# Patient Record
Sex: Female | Born: 1940
Health system: Southern US, Community
[De-identification: ages and names within clinical notes are randomized; demographics above are authoritative.]

## PROBLEM LIST (undated history)

## (undated) DIAGNOSIS — F419 Anxiety disorder, unspecified: Secondary | ICD-10-CM

## (undated) DIAGNOSIS — F329 Major depressive disorder, single episode, unspecified: Secondary | ICD-10-CM

## (undated) DIAGNOSIS — Z9289 Personal history of other medical treatment: Secondary | ICD-10-CM

## (undated) DIAGNOSIS — K819 Cholecystitis, unspecified: Secondary | ICD-10-CM

## (undated) DIAGNOSIS — E785 Hyperlipidemia, unspecified: Secondary | ICD-10-CM

## (undated) DIAGNOSIS — F32A Depression, unspecified: Secondary | ICD-10-CM

## (undated) DIAGNOSIS — I10 Essential (primary) hypertension: Secondary | ICD-10-CM

## (undated) DIAGNOSIS — I639 Cerebral infarction, unspecified: Secondary | ICD-10-CM

## (undated) DIAGNOSIS — Z8616 Personal history of COVID-19: Secondary | ICD-10-CM

## (undated) DIAGNOSIS — Z87828 Personal history of other (healed) physical injury and trauma: Secondary | ICD-10-CM

## (undated) DIAGNOSIS — K5909 Other constipation: Secondary | ICD-10-CM

## (undated) DIAGNOSIS — M1990 Unspecified osteoarthritis, unspecified site: Secondary | ICD-10-CM

## (undated) DIAGNOSIS — Z8673 Personal history of transient ischemic attack (TIA), and cerebral infarction without residual deficits: Secondary | ICD-10-CM

## (undated) DIAGNOSIS — R252 Cramp and spasm: Secondary | ICD-10-CM

## (undated) HISTORY — PX: COLONOSCOPY: SHX174

## (undated) HISTORY — PX: DIAGNOSTIC LAPAROSCOPY: SUR761

## (undated) HISTORY — PX: APPENDECTOMY: SHX54

## (undated) HISTORY — PX: EXPLORATORY LAPAROTOMY: SUR591

## (undated) HISTORY — PX: ABDOMINAL HYSTERECTOMY: SHX81

## (undated) HISTORY — PX: DILATION AND CURETTAGE OF UTERUS: SHX78

## (undated) HISTORY — PX: TONSILLECTOMY: SUR1361

---

## 2000-05-02 ENCOUNTER — Encounter (INDEPENDENT_AMBULATORY_CARE_PROVIDER_SITE_OTHER): Payer: Self-pay

## 2000-05-02 ENCOUNTER — Ambulatory Visit (HOSPITAL_COMMUNITY): Admission: RE | Admit: 2000-05-02 | Discharge: 2000-05-02 | Payer: Self-pay | Admitting: Gastroenterology

## 2003-10-21 ENCOUNTER — Other Ambulatory Visit: Admission: RE | Admit: 2003-10-21 | Discharge: 2003-10-21 | Payer: Self-pay | Admitting: Family Medicine

## 2005-09-07 ENCOUNTER — Encounter: Admission: RE | Admit: 2005-09-07 | Discharge: 2005-09-07 | Payer: Self-pay | Admitting: Family Medicine

## 2006-12-15 ENCOUNTER — Other Ambulatory Visit: Admission: RE | Admit: 2006-12-15 | Discharge: 2006-12-15 | Payer: Self-pay | Admitting: Family Medicine

## 2009-08-17 ENCOUNTER — Emergency Department (HOSPITAL_COMMUNITY)
Admission: EM | Admit: 2009-08-17 | Discharge: 2009-08-17 | Payer: Self-pay | Source: Home / Self Care | Admitting: Emergency Medicine

## 2010-07-19 DIAGNOSIS — I639 Cerebral infarction, unspecified: Secondary | ICD-10-CM

## 2010-07-19 HISTORY — DX: Cerebral infarction, unspecified: I63.9

## 2010-07-25 ENCOUNTER — Observation Stay (HOSPITAL_COMMUNITY)
Admission: EM | Admit: 2010-07-25 | Discharge: 2010-07-27 | Payer: Self-pay | Source: Home / Self Care | Attending: Internal Medicine | Admitting: Internal Medicine

## 2010-07-26 ENCOUNTER — Encounter (INDEPENDENT_AMBULATORY_CARE_PROVIDER_SITE_OTHER): Payer: Self-pay | Admitting: Internal Medicine

## 2010-07-28 ENCOUNTER — Ambulatory Visit: Admission: RE | Admit: 2010-07-28 | Discharge: 2010-07-28 | Payer: Self-pay | Source: Home / Self Care

## 2010-07-28 ENCOUNTER — Encounter: Payer: Self-pay | Admitting: Cardiovascular Disease

## 2010-07-28 ENCOUNTER — Encounter (INDEPENDENT_AMBULATORY_CARE_PROVIDER_SITE_OTHER): Payer: Self-pay | Admitting: Family Medicine

## 2010-07-28 ENCOUNTER — Ambulatory Visit (HOSPITAL_COMMUNITY)
Admission: RE | Admit: 2010-07-28 | Discharge: 2010-07-28 | Payer: Self-pay | Source: Home / Self Care | Attending: Family Medicine | Admitting: Family Medicine

## 2010-07-28 DIAGNOSIS — G459 Transient cerebral ischemic attack, unspecified: Secondary | ICD-10-CM | POA: Insufficient documentation

## 2010-07-30 ENCOUNTER — Encounter: Payer: Self-pay | Admitting: Cardiovascular Disease

## 2010-07-30 ENCOUNTER — Telehealth (INDEPENDENT_AMBULATORY_CARE_PROVIDER_SITE_OTHER): Payer: Self-pay | Admitting: *Deleted

## 2010-08-03 LAB — CBC
HCT: 38 % (ref 36.0–46.0)
HCT: 34.9 % — ABNORMAL LOW (ref 36.0–46.0)
Hemoglobin: 12.7 g/dL (ref 12.0–15.0)
Hemoglobin: 11.7 g/dL — ABNORMAL LOW (ref 12.0–15.0)
MCH: 31.3 pg (ref 26.0–34.0)
MCH: 31.9 pg (ref 26.0–34.0)
MCHC: 33.4 g/dL (ref 30.0–36.0)
MCHC: 33.5 g/dL (ref 30.0–36.0)
MCV: 93.6 fL (ref 78.0–100.0)
MCV: 95.1 fL (ref 78.0–100.0)
Platelets: 231 10*3/uL (ref 150–400)
Platelets: 192 10*3/uL (ref 150–400)
RBC: 4.06 MIL/uL (ref 3.87–5.11)
RBC: 3.67 MIL/uL — ABNORMAL LOW (ref 3.87–5.11)
RDW: 12.4 % (ref 11.5–15.5)
RDW: 12.8 % (ref 11.5–15.5)
WBC: 5.7 10*3/uL (ref 4.0–10.5)
WBC: 3.9 10*3/uL — ABNORMAL LOW (ref 4.0–10.5)

## 2010-08-03 LAB — URINALYSIS, MICROSCOPIC ONLY
Bilirubin Urine: NEGATIVE
Hgb urine dipstick: NEGATIVE
Ketones, ur: NEGATIVE mg/dL
Nitrite: NEGATIVE
Protein, ur: NEGATIVE mg/dL
Specific Gravity, Urine: 1.009 (ref 1.005–1.030)
Urine Glucose, Fasting: NEGATIVE mg/dL
Urobilinogen, UA: 0.2 mg/dL (ref 0.0–1.0)
pH: 7 (ref 5.0–8.0)

## 2010-08-03 LAB — DIFFERENTIAL
Basophils Absolute: 0 10*3/uL (ref 0.0–0.1)
Basophils Absolute: 0 10*3/uL (ref 0.0–0.1)
Basophils Relative: 0 % (ref 0–1)
Basophils Relative: 1 % (ref 0–1)
Eosinophils Absolute: 0 10*3/uL (ref 0.0–0.7)
Eosinophils Absolute: 0.1 10*3/uL (ref 0.0–0.7)
Eosinophils Relative: 1 % (ref 0–5)
Eosinophils Relative: 2 % (ref 0–5)
Lymphocytes Relative: 20 % (ref 12–46)
Lymphocytes Relative: 39 % (ref 12–46)
Lymphs Abs: 1.1 10*3/uL (ref 0.7–4.0)
Lymphs Abs: 1.5 10*3/uL (ref 0.7–4.0)
Monocytes Absolute: 0.3 10*3/uL (ref 0.1–1.0)
Monocytes Absolute: 0.4 10*3/uL (ref 0.1–1.0)
Monocytes Relative: 6 % (ref 3–12)
Monocytes Relative: 9 % (ref 3–12)
Neutro Abs: 4.2 10*3/uL (ref 1.7–7.7)
Neutro Abs: 1.9 10*3/uL (ref 1.7–7.7)
Neutrophils Relative %: 74 % (ref 43–77)
Neutrophils Relative %: 49 % (ref 43–77)

## 2010-08-03 LAB — COMPREHENSIVE METABOLIC PANEL
ALT: 25 U/L (ref 0–35)
ALT: 26 U/L (ref 0–35)
AST: 21 U/L (ref 0–37)
AST: 27 U/L (ref 0–37)
Albumin: 4.4 g/dL (ref 3.5–5.2)
Albumin: 3.2 g/dL — ABNORMAL LOW (ref 3.5–5.2)
Alkaline Phosphatase: 39 U/L (ref 39–117)
Alkaline Phosphatase: 34 U/L — ABNORMAL LOW (ref 39–117)
BUN: 11 mg/dL (ref 6–23)
BUN: 12 mg/dL (ref 6–23)
CO2: 24 mEq/L (ref 19–32)
CO2: 27 mEq/L (ref 19–32)
Calcium: 9.3 mg/dL (ref 8.4–10.5)
Calcium: 8.9 mg/dL (ref 8.4–10.5)
Chloride: 100 mEq/L (ref 96–112)
Chloride: 111 mEq/L (ref 96–112)
Creatinine, Ser: 0.71 mg/dL (ref 0.4–1.2)
Creatinine, Ser: 0.73 mg/dL (ref 0.4–1.2)
GFR calc Af Amer: >60 mL/min (ref >60)
GFR calc Af Amer: >60 mL/min (ref >60)
GFR calc non Af Amer: >60 mL/min (ref >60)
GFR calc non Af Amer: >60 mL/min (ref >60)
Glucose, Bld: 104 mg/dL — ABNORMAL HIGH (ref 70–99)
Glucose, Bld: 96 mg/dL (ref 70–99)
Potassium: 3.7 mEq/L (ref 3.5–5.1)
Potassium: 4 mEq/L (ref 3.5–5.1)
Sodium: 134 mEq/L — ABNORMAL LOW (ref 135–145)
Sodium: 143 mEq/L (ref 135–145)
Total Bilirubin: 0.7 mg/dL (ref 0.3–1.2)
Total Bilirubin: 0.9 mg/dL (ref 0.3–1.2)
Total Protein: 5.8 g/dL — ABNORMAL LOW (ref 6.0–8.3)
Total Protein: 5.5 g/dL — ABNORMAL LOW (ref 6.0–8.3)

## 2010-08-03 LAB — CK TOTAL AND CKMB (NOT AT ARMC)
CK, MB: 2.6 ng/mL (ref 0.3–4.0)
Relative Index: 1.4 (ref 0.0–2.5)
Total CK: 183 U/L — ABNORMAL HIGH (ref 7–177)

## 2010-08-03 LAB — CARDIAC PANEL(CRET KIN+CKTOT+MB+TROPI)
CK, MB: 4.8 ng/mL — ABNORMAL HIGH (ref 0.3–4.0)
CK, MB: 3.4 ng/mL (ref 0.3–4.0)
CK, MB: 2.1 ng/mL (ref 0.3–4.0)
Relative Index: 1.1 (ref 0.0–2.5)
Relative Index: 0.9 (ref 0.0–2.5)
Relative Index: 0.6 (ref 0.0–2.5)
Total CK: 444 U/L — ABNORMAL HIGH (ref 7–177)
Total CK: 397 U/L — ABNORMAL HIGH (ref 7–177)
Total CK: 324 U/L — ABNORMAL HIGH (ref 7–177)
Troponin I: 0.01 ng/mL (ref 0.00–0.06)
Troponin I: 0.01 ng/mL (ref 0.00–0.06)
Troponin I: 0.01 ng/mL (ref 0.00–0.06)

## 2010-08-03 LAB — LIPID PANEL
Cholesterol: 163 mg/dL (ref 0–200)
HDL: 62 mg/dL (ref >39)
LDL Cholesterol: 85 mg/dL (ref 0–99)
Total CHOL/HDL Ratio: 2.6 RATIO
Triglycerides: 81 mg/dL (ref <150)
VLDL: 16 mg/dL (ref 0–40)

## 2010-08-03 LAB — TROPONIN I: Troponin I: 0.01 ng/mL (ref 0.00–0.06)

## 2010-08-03 LAB — TSH: TSH: 1.856 u[IU]/mL (ref 0.350–4.500)

## 2010-08-03 LAB — URINE CULTURE
Colony Count: NO GROWTH
Culture  Setup Time: 201201082208
Culture: NO GROWTH

## 2010-08-03 LAB — VITAMIN B12: Vitamin B-12: 667 pg/mL (ref 211–911)

## 2010-08-03 LAB — MAGNESIUM: Magnesium: 2.1 mg/dL (ref 1.5–2.5)

## 2010-08-03 LAB — FOLATE: Folate: >20 ng/mL

## 2010-08-08 NOTE — H&P (Signed)
NAME:  [name], [name]              ACCOUNT NO.:  [id number]  MEDICAL RECORD NO.:  [id number]          PATIENT TYPE:  EMS  LOCATION:  MAJO                         FACILITY:  [facility]  PHYSICIAN:  Homero Fellers, MD   DATE OF BIRTH:  February 24, 1941  DATE OF ADMISSION:  07/25/2010 DATE OF DISCHARGE:                             HISTORY & PHYSICAL   CHIEF COMPLAINT:  Dizziness and unsteady gait.  HISTORY OF PRESENT ILLNESS:  This is a 70 year old woman who was brought by EMS because family could not get her to stand up.  This morning apparently she had a violent sneeze, followed by lightheadedness and inability to walk straight.  According to the family, she was leaning to the right side when she walks.  This got worse to the extent that she could not even get up or stand up by herself.  By the time the EMS brought her into the emergency room, she could not even sit up unaided. She had some nausea but no vomiting.  She had no chest pain, no shortness of breath.  She was given some Ativan in the emergency room to obtain an MRI of the brain, following which she got restless and itchy.  She was given some Phenergan which made her more restless and then followed by  some benadryl.  The head CT in the emergency room showed no acute findings.  MRI of the brain has been but the result is unavailable at this time.  Also in the emergency room, she got some meclizine with no improvement of her symptoms.  The patient has no prior history of stroke.  No fever, cough or cold symptoms.  PAST MEDICAL HISTORY:  High blood pressure.  MEDICATIONS:  Please refer to medication record for details.  She takes citalopram, Lipitor and lisinopril.  SOCIAL HISTORY:  No smoking, alcohol or drug.  The patient lives alone, will get support from her family members.  FAMILY HISTORY:  Noncontributory.  REVIEW OF SYSTEMS:  A 10-point review of system is negative except as described above.  PHYSICAL EXAMINATION:   VITAL SIGNS:  Blood pressure 131/70, pulse 62, respirations 16, temperature 97.8, O2 sat 97%. GENERAL:  The patient is comfortable in no distress.  She appears quite agitated and debilitated, drowsy during my evaluation.  HEENT:  PERRLA. NECK:  Supple.  No JVD. LUNGS:  Clear bilaterally to auscultation. HEART:  S1 and S2.  No murmurs. ABDOMEN:  Soft, nontender. EXTREMITIES:  No edema. SKIN:  No rash or lesions. NEUROLOGIC:  The patient is sleepy but arousable.  Coordination appears normal as far as diadochokinesia is concerned.  She is unable to stand up from a sitting position and when I attempted to stand her up, she could not maintain an upright position.  Gait was not tested because of fall risk.  LABORATORY FINDINGS:  CBC is essentially normal with a white count of 5.7, hemoglobin 10.7.  Chemistry is also normal.  Potassium 3.7, BUN 11, creatinine 0.71, glucose 104.  EKG is nondiagnostic.  She has normal ST and T-wave pattern.  CT of the head has been done which showed normal exam.  MRI  of the brain is pending. ASSESSMENT:  This is a 70 year old woman admitted with dizziness, unsteady gait and mild confusion.  The concerned here is to rule out the CVA.  A mass in the cerebello pontine angle or cerebellum may give this presentation, so cerebellar mass or stroke is one of my differential.  The patient could also have vertigo labyrinthitis especially since her symptoms was precipitated acutely by sneezing episode.  She was not helped by meclizine so will use scopolamine at this time.  Full workup for stroke would be done including a 2D echo and carotid Doppler.  The report of MRI of the brain should be followed.  I will also get orthostatic vital signs.   She will continue her home medicines.  She was on DVT prophylaxis, her condition is stable otherwise.     Homero Fellers, MD     FA/MEDQ  D:  07/26/2010  T:  07/26/2010  Job:  [id number]  cc:   [name] [name],  M.D.  Electronically Signed by Homero Fellers  on 08/08/2010 10:44:47 PM

## 2010-08-20 NOTE — Progress Notes (Signed)
Summary: Faxed records to [facility] at [facility].  Faxed records to [facility] at [facility]. Carotid Duplex (Prelim) [id number] Pho:[phone] Marylou Mccoy  July 30, 2010 10:53 AM

## 2010-08-20 NOTE — Miscellaneous (Signed)
Summary: Orders Update  Clinical Lists Changes  Problems: Added new problem of UNSPECIFIED TRANSIENT CEREBRAL ISCHEMIA (ICD-435.9) Orders: Added new Test order of Carotid Duplex (Carotid Duplex) - Signed 

## 2010-12-04 NOTE — Procedures (Signed)
[facility]  Patient:    [name] [name], [name] [name]                       MRN: [id number] Proc. Date: 05/02/00 Attending:  Florencia Reasons, M.D. CC:         Willis Modena. Dreiling, M.D.   Procedure Report  PROCEDURE:  Colonoscopy with biopsies.  INDICATIONS FOR PROCEDURE:  This is a 70 year old female with chronic constipation alternating with periodic diarrhea.  FINDINGS:  Normal exam to the terminal ileum.  DESCRIPTION OF PROCEDURE:  The nature, purpose and risk of the procedure were discussed with the patient who provided written consent. Sedation was fentanyl 100 mcg and Versed 10 mg IV without arrhythmias or desaturation. The Olympus pediatric video colonoscope was advanced through a slightly spastic sigmoid region to the terminal ileum which had a normal appearance, after which pullback was initiated.  There was a tiny 2 mm sessile possible early polyp above the cecum removed by a single cold biopsy, but the colon was otherwise normal, without evidence of other polyps, cancer, colitis, vascular malformations or diverticular disease. The quality of the prep was excellent and it is felt that all areas were well seen.  Random biopsies were obtained from the distal colon to look for evidence of microscopic colitis or collagenous colitis and retroflexion of the rectum was unremarkable.  The patient tolerated the procedure well and there were no apparent complications.  IMPRESSION:  Normal colonoscopy to the terminal ileum apart from questionable early polyp in the proximal colon.  PLAN:  Await pathology. DD:  05/02/00 TD:  05/02/00 Job: [id number]

## 2012-05-25 ENCOUNTER — Other Ambulatory Visit: Payer: Self-pay | Admitting: Orthopedic Surgery

## 2012-05-25 ENCOUNTER — Encounter (HOSPITAL_BASED_OUTPATIENT_CLINIC_OR_DEPARTMENT_OTHER): Payer: Self-pay | Admitting: *Deleted

## 2012-05-25 NOTE — Progress Notes (Signed)
To come in for [id number]  

## 2012-05-29 ENCOUNTER — Encounter (HOSPITAL_BASED_OUTPATIENT_CLINIC_OR_DEPARTMENT_OTHER)
Admission: RE | Admit: 2012-05-29 | Discharge: 2012-05-29 | Disposition: A | Discharge status: Home / Self Care | Payer: Medicare Other | Source: Ambulatory Visit | Attending: Orthopedic Surgery | Admitting: Orthopedic Surgery

## 2012-05-29 LAB — BASIC METABOLIC PANEL
CO2: 28 mEq/L (ref 19–32)
Calcium: 9.7 mg/dL (ref 8.4–10.5)
Chloride: 102 mEq/L (ref 96–112)
Glucose, Bld: 98 mg/dL (ref 70–99)
Sodium: 139 mEq/L (ref 135–145)

## 2012-05-29 NOTE — H&P (Signed)
  [name] [name] is an 71 y.o. female.   Chief Complaint: c/o chronic and progressive STS left long and small fingers HPI: Ms. [name] [name] returns for follow up evaluation of her hands. She now has 3 locking trigger fingers. Her right long finger is locking in flexion, her left long and small fingers are locking. She has had the left long trigger finger dating back to 2008.    Ms. [name] [name] is not interested in steroid injection. She would like to proceed with release of the A-1 pulleys of the left long and small fingers. The surgery, after care, risks and benefits were described in detail.  We will accomplish this at a mutually convenient time under local anesthesia and sedation  Past Medical History  Diagnosis Date  . Hypertension   . Hyperlipemia   . Stroke 1/12    rt sides weakness-lasted 36hr  . Arthritis   . Depression   . Anxiety     Past Surgical History  Procedure Date  . Dilation and curettage of uterus   . Diagnostic laparoscopy     ovarian cyst  . Abdominal hysterectomy   . Appendectomy   . Tonsillectomy   . Colonoscopy     No family history on file. Social History:  reports that she has never smoked. She does not have any smokeless tobacco history on file. She reports that she drinks alcohol. She reports that she does not use illicit drugs.  Allergies:  Allergies  Allergen Reactions  . Codeine Nausea And Vomiting    No prescriptions prior to admission    Results for orders placed during the hospital encounter of 05/30/12 (from the past 48 hour(s))  BASIC METABOLIC PANEL     Status: Abnormal   Collection Time   05/29/12 12:00 PM      Component Value Range Comment   Sodium 139  135 - 145 mEq/L    Potassium 4.1  3.5 - 5.1 mEq/L    Chloride 102  96 - 112 mEq/L    CO2 28  19 - 32 mEq/L    Glucose, Bld 98  70 - 99 mg/dL    BUN 16  6 - 23 mg/dL    Creatinine, Ser 1.61  0.50 - 1.10 mg/dL    Calcium 9.7  8.4 - 09.6 mg/dL    GFR calc non Af Amer 67 (*) >90 mL/min    GFR calc Af Amer 78 (*) >90 mL/min     No results found.   Pertinent items are noted in HPI.  Height 5\' 4"  (1.626 m), weight 74.844 kg (165 lb).  General appearance: alert Head: Normocephalic, without obvious abnormality Neck: supple, symmetrical, trachea midline Resp: clear to auscultation bilaterally Cardio: regular rate and rhythm GI: normal findings: bowel sounds normal Extremities:  She has minimal stigmata of osteoarthritis. She has full ROM of her fingers in flexion but locks her right long finger, left long finger, and left small finger in flexion. She is tender over the A-1 pulleys. Her pulses and capillary refill are intact.   Pulses: 2+ and symmetric Skin: normal Neurologic: Grossly normal    Assessment/Plan Impression: Chronic STS left long and small fingers  Plan: To the OR for release A-1 pulleys left long and small fingers.The procedure, risks,benefits and post-op course were discussed with the patient at length and they were in agreement with the plan.   Jayda White J 05/29/2012, 1:36 PM

## 2012-05-30 ENCOUNTER — Encounter (HOSPITAL_BASED_OUTPATIENT_CLINIC_OR_DEPARTMENT_OTHER): Payer: Self-pay | Admitting: Anesthesiology

## 2012-05-30 ENCOUNTER — Encounter (HOSPITAL_BASED_OUTPATIENT_CLINIC_OR_DEPARTMENT_OTHER)
Admission: RE | Disposition: A | Discharge status: Home / Self Care | Payer: Self-pay | Source: Ambulatory Visit | Attending: Orthopedic Surgery

## 2012-05-30 ENCOUNTER — Ambulatory Visit (HOSPITAL_BASED_OUTPATIENT_CLINIC_OR_DEPARTMENT_OTHER)
Admission: RE | Admit: 2012-05-30 | Discharge: 2012-05-30 | Disposition: A | Discharge status: Home / Self Care | Payer: Medicare Other | Source: Ambulatory Visit | Attending: Orthopedic Surgery | Admitting: Orthopedic Surgery

## 2012-05-30 DIAGNOSIS — F3289 Other specified depressive episodes: Secondary | ICD-10-CM | POA: Insufficient documentation

## 2012-05-30 DIAGNOSIS — Z01812 Encounter for preprocedural laboratory examination: Secondary | ICD-10-CM | POA: Insufficient documentation

## 2012-05-30 DIAGNOSIS — Z8673 Personal history of transient ischemic attack (TIA), and cerebral infarction without residual deficits: Secondary | ICD-10-CM | POA: Insufficient documentation

## 2012-05-30 DIAGNOSIS — F411 Generalized anxiety disorder: Secondary | ICD-10-CM | POA: Insufficient documentation

## 2012-05-30 DIAGNOSIS — Z79899 Other long term (current) drug therapy: Secondary | ICD-10-CM | POA: Insufficient documentation

## 2012-05-30 DIAGNOSIS — E785 Hyperlipidemia, unspecified: Secondary | ICD-10-CM | POA: Insufficient documentation

## 2012-05-30 DIAGNOSIS — I10 Essential (primary) hypertension: Secondary | ICD-10-CM | POA: Insufficient documentation

## 2012-05-30 DIAGNOSIS — Z01810 Encounter for preprocedural cardiovascular examination: Secondary | ICD-10-CM | POA: Insufficient documentation

## 2012-05-30 DIAGNOSIS — F329 Major depressive disorder, single episode, unspecified: Secondary | ICD-10-CM | POA: Insufficient documentation

## 2012-05-30 DIAGNOSIS — M129 Arthropathy, unspecified: Secondary | ICD-10-CM | POA: Insufficient documentation

## 2012-05-30 DIAGNOSIS — M1990 Unspecified osteoarthritis, unspecified site: Secondary | ICD-10-CM | POA: Insufficient documentation

## 2012-05-30 DIAGNOSIS — M6530 Trigger finger, unspecified finger: Secondary | ICD-10-CM | POA: Insufficient documentation

## 2012-05-30 HISTORY — DX: Essential (primary) hypertension: I10

## 2012-05-30 HISTORY — DX: Cerebral infarction, unspecified: I63.9

## 2012-05-30 HISTORY — DX: Hyperlipidemia, unspecified: E78.5

## 2012-05-30 HISTORY — PX: TRIGGER FINGER RELEASE: SHX641

## 2012-05-30 HISTORY — DX: Major depressive disorder, single episode, unspecified: F32.9

## 2012-05-30 HISTORY — DX: Depression, unspecified: F32.A

## 2012-05-30 HISTORY — DX: Anxiety disorder, unspecified: F41.9

## 2012-05-30 HISTORY — DX: Unspecified osteoarthritis, unspecified site: M19.90

## 2012-05-30 LAB — POCT HEMOGLOBIN-HEMACUE: Hemoglobin: 13.1 g/dL (ref 12.0–15.0)

## 2012-05-30 SURGERY — RELEASE, A1 PULLEY, FOR TRIGGER FINGER
Anesthesia: General | Site: Finger | Laterality: Left | Wound class: Clean

## 2012-05-30 MED ORDER — LACTATED RINGERS IV SOLN: INTRAVENOUS | Status: DC

## 2012-05-30 MED ORDER — CHLORHEXIDINE GLUCONATE 4 % EX LIQD: 60.0000 mL | Freq: Once | CUTANEOUS | Status: DC

## 2012-05-30 SURGICAL SUPPLY — 31 items
BLADE SURG 15 STRL LF DISP TIS (BLADE) ×1 IMPLANT
BLADE SURG 15 STRL SS (BLADE) ×1
BNDG ELASTIC 2 VLCR STRL LF (GAUZE/BANDAGES/DRESSINGS) ×2 IMPLANT
BNDG ESMARK 4X9 LF (GAUZE/BANDAGES/DRESSINGS) ×2 IMPLANT
BRUSH SCRUB EZ PLAIN DRY (MISCELLANEOUS) ×2 IMPLANT
CLOTH BEACON ORANGE TIMEOUT ST (SAFETY) ×2 IMPLANT
CORDS BIPOLAR (ELECTRODE) IMPLANT
COVER MAYO STAND STRL (DRAPES) ×2 IMPLANT
COVER TABLE BACK 60X90 (DRAPES) ×2 IMPLANT
CUFF TOURNIQUET SINGLE 18IN (TOURNIQUET CUFF) ×2 IMPLANT
DECANTER SPIKE VIAL GLASS SM (MISCELLANEOUS) IMPLANT
DRAPE EXTREMITY T 121X128X90 (DRAPE) ×2 IMPLANT
DRAPE SURG 17X23 STRL (DRAPES) ×2 IMPLANT
GAUZE SPONGE 4X4 12PLY STRL LF (GAUZE/BANDAGES/DRESSINGS) ×4 IMPLANT
GAUZE XEROFORM 1X8 LF (GAUZE/BANDAGES/DRESSINGS) ×2 IMPLANT
GLOVE BIO SURGEON STRL SZ 6.5 (GLOVE) ×2 IMPLANT
GLOVE BIOGEL M STRL SZ7.5 (GLOVE) ×2 IMPLANT
GLOVE ORTHO TXT STRL SZ7.5 (GLOVE) ×2 IMPLANT
GOWN PREVENTION PLUS XLARGE (GOWN DISPOSABLE) ×2 IMPLANT
GOWN STRL REIN XL XLG (GOWN DISPOSABLE) ×4 IMPLANT
NEEDLE 27GAX1X1/2 (NEEDLE) ×2 IMPLANT
PACK BASIN DAY SURGERY FS (CUSTOM PROCEDURE TRAY) ×2 IMPLANT
PAD CAST 4YDX4 CTTN HI CHSV (CAST SUPPLIES) IMPLANT
PADDING CAST COTTON 4X4 STRL (CAST SUPPLIES)
SPONGE GAUZE 4X4 12PLY (GAUZE/BANDAGES/DRESSINGS) IMPLANT
STOCKINETTE 4X48 STRL (DRAPES) ×2 IMPLANT
SUT PROLENE 4 0 P 3 18 (SUTURE) ×2 IMPLANT
SYR CONTROL 10ML LL (SYRINGE) ×2 IMPLANT
TOWEL OR 17X24 6PK STRL BLUE (TOWEL DISPOSABLE) ×2 IMPLANT
UNDERPAD 30X30 INCONTINENT (UNDERPADS AND DIAPERS) ×2 IMPLANT
WATER STERILE IRR 1000ML POUR (IV SOLUTION) IMPLANT

## 2012-05-31 MED ORDER — LIDOCAINE HCL 2 % IJ SOLN
INTRAMUSCULAR | Status: DC | PRN
  Administered 2012-05-30: 4.5 mL

## 2012-05-31 NOTE — Op Note (Signed)
NAME:  Fedor, Aime              ACCOUNT NO.:  [id number]  MEDICAL RECORD NO.:  [id number]  LOCATION:                               FACILITY:  [facility]  PHYSICIAN:  Katy Fitch. Delesha Pohlman, M.D.      DATE OF BIRTH:  DATE OF PROCEDURE:  05/30/2012 DATE OF DISCHARGE:  05/30/2012                              OPERATIVE REPORT   PREOPERATIVE DIAGNOSES:  Chronic stenosing tenosynovitis of the left long, ring, and small fingers with inability to flex long finger and marked swelling of flexor digitorum superficialis tendon noted clinically, locking at the A1 pulley, also significant synovitis of left small finger flexor tendons.  POSTOPERATIVE DIAGNOSES:  Chronic stenosing tenosynovitis of the left long, ring, and small fingers with inability to flex long finger and marked swelling of flexor digitorum superficialis tendon noted clinically, locking at the A1 pulley, also significant synovitis of left small finger flexor tendons.  OPERATION: 1. Release of left long finger A1 pulley. 2. Partial resection of flexor digitorum superficialis tendon to     relieve chronic locking. 3. Release of left ring finger A1 pulley. 4. Release of left small finger A1 pulley.  OPERATING SURGEON:  Katy Fitch. Sofija Antwi, MD  ASSISTANT:  Marveen Reeks Dasnoit, PA-C  ANESTHESIA:  General by LMA.  SUPERVISING ANESTHESIOLOGIST:  Achille Rich, MD  INDICATIONS:  Maribelle Hopple is a 71 year old woman referred for evaluation and management of multiple trigger fingers.  She has triggering of her right long finger and her long ring and small fingers on the left.  We had detailed anesthesia and surgical informed consent in the office.  She understands that she has marked swelling of her left long finger and inability to flex the finger due to locking at the A1 pulley.  We advised that we would proceed with release of the affected A1 pulley to left hand at today's surgical encounter and would make appropriate adjustments in  the size of her tendons to enable her to recover full flexion.  After informed consent, she was brought to the operating room at this time.  PROCEDURE:  Cheryn Lundquist was interviewed by Dr. Chaney Malling in the holding area and after detailed anesthesia informed consent, selected general anesthesia by LMA technique.  In room 6 under Dr. Seward Meth direct supervision, general anesthesia by LMA technique was induced followed by routine Betadine scrub and paint of the left upper extremity.  Sterile stockinette and impervious arthroscopy drapes were applied followed by exsanguination of left arm with Esmarch bandage, inflation of arterial tourniquet on proximal brachium to 220 mmHg.  Following routine surgical time-out, procedure commenced with placement of the fingers and a lead hand maintaining full MP and IP joint extension.  Oblique incisions were fashioned directly over the palpably thickened A1 pulleys of the left long, ring, and small fingers. Subcutaneous tissues were carefully divided, the long finger release of the palmar fascia.  The A1 pulley was very thickened with a small ganglion.  The ganglion was removed with a rongeur.  The pulley was split and the flexor tendon delivered.  There was a 4 mm bump on the tendon due to chronic trapping of the tendon beneath the A1 pulley. This was relieved  by subtotal resection of the flexor digitorum superficialis tendon with scalpel dissection and scissors dissection.  Tendon was thinned to allow passage beneath the A2 pulley distally and the A0 pulley proximally.  Thereafter attention was directed to the ring finger where the A1 pulley was isolated, split with scalpel scissors and the small finger with the A1 pulley was identified.  With proper placement of Ragnell retractors, we split the A1 pulley with scalpel scissors.  The flexor tendons were invested in fibrotic tenosynovium.  This was removed by scissors dissection.  When we  completed the dissection of the long ring and small finger A1 pulleys, we noted full passive range of motion of the long ring and small fingers without residual triggering.  The wound was then repaired with intradermal 4-0 Prolene.  Compressive dressing was applied with sterile gauze and Ace wrap.  There were no apparent complications.  For aftercare, Ms. Ciulla was provided a prescription for Ultram 50 mg 1-2 tablets p.o. q.4-6 hours p.r.n. pain, 20 tablets without refill.  She will also use over-the-counter analgesics with Tylenol and/or nonsteroidal medication as tolerated.     Katy Fitch Luvina Poirier, M.D.     RVS/MEDQ  D:  05/30/2012  T:  05/31/2012  Job:  [id number]

## 2012-06-01 ENCOUNTER — Encounter (HOSPITAL_BASED_OUTPATIENT_CLINIC_OR_DEPARTMENT_OTHER): Payer: Self-pay | Admitting: Orthopedic Surgery

## 2015-03-01 ENCOUNTER — Emergency Department (HOSPITAL_COMMUNITY): Payer: Medicare Other

## 2015-03-01 ENCOUNTER — Emergency Department (HOSPITAL_COMMUNITY)
Admission: EM | Admit: 2015-03-01 | Discharge: 2015-03-01 | Disposition: A | Discharge status: Home / Self Care | Payer: Medicare Other | Attending: Emergency Medicine | Admitting: Emergency Medicine

## 2015-03-01 ENCOUNTER — Encounter (HOSPITAL_COMMUNITY): Payer: Self-pay

## 2015-03-01 DIAGNOSIS — M1990 Unspecified osteoarthritis, unspecified site: Secondary | ICD-10-CM | POA: Insufficient documentation

## 2015-03-01 DIAGNOSIS — F329 Major depressive disorder, single episode, unspecified: Secondary | ICD-10-CM | POA: Insufficient documentation

## 2015-03-01 DIAGNOSIS — I10 Essential (primary) hypertension: Secondary | ICD-10-CM | POA: Insufficient documentation

## 2015-03-01 DIAGNOSIS — Z79899 Other long term (current) drug therapy: Secondary | ICD-10-CM | POA: Insufficient documentation

## 2015-03-01 DIAGNOSIS — S6991XA Unspecified injury of right wrist, hand and finger(s), initial encounter: Secondary | ICD-10-CM | POA: Diagnosis present

## 2015-03-01 DIAGNOSIS — Y92828 Other wilderness area as the place of occurrence of the external cause: Secondary | ICD-10-CM | POA: Diagnosis not present

## 2015-03-01 DIAGNOSIS — Y998 Other external cause status: Secondary | ICD-10-CM | POA: Diagnosis not present

## 2015-03-01 DIAGNOSIS — Z7982 Long term (current) use of aspirin: Secondary | ICD-10-CM | POA: Insufficient documentation

## 2015-03-01 DIAGNOSIS — Z8673 Personal history of transient ischemic attack (TIA), and cerebral infarction without residual deficits: Secondary | ICD-10-CM | POA: Diagnosis not present

## 2015-03-01 DIAGNOSIS — Y9389 Activity, other specified: Secondary | ICD-10-CM | POA: Diagnosis not present

## 2015-03-01 DIAGNOSIS — S52591A Other fractures of lower end of right radius, initial encounter for closed fracture: Secondary | ICD-10-CM | POA: Insufficient documentation

## 2015-03-01 DIAGNOSIS — F419 Anxiety disorder, unspecified: Secondary | ICD-10-CM | POA: Diagnosis not present

## 2015-03-01 DIAGNOSIS — W1789XA Other fall from one level to another, initial encounter: Secondary | ICD-10-CM | POA: Insufficient documentation

## 2015-03-01 DIAGNOSIS — E785 Hyperlipidemia, unspecified: Secondary | ICD-10-CM | POA: Diagnosis not present

## 2015-03-01 DIAGNOSIS — S52501A Unspecified fracture of the lower end of right radius, initial encounter for closed fracture: Secondary | ICD-10-CM

## 2015-03-01 LAB — BASIC METABOLIC PANEL
Anion gap: 14 (ref 5–15)
BUN: 19 mg/dL (ref 6–20)
CALCIUM: 9.4 mg/dL (ref 8.9–10.3)
CO2: 17 mmol/L — AB (ref 22–32)
CREATININE: 0.81 mg/dL (ref 0.44–1.00)
Chloride: 104 mmol/L (ref 101–111)
GFR calc Af Amer: >60 mL/min (ref >60)
GFR calc non Af Amer: >60 mL/min (ref >60)
GLUCOSE: 82 mg/dL (ref 65–99)
Potassium: 4.6 mmol/L (ref 3.5–5.1)
Sodium: 135 mmol/L (ref 135–145)

## 2015-03-01 LAB — CBC
HEMATOCRIT: 39.1 % (ref 36.0–46.0)
HEMOGLOBIN: 13.1 g/dL (ref 12.0–15.0)
MCH: 31.6 pg (ref 26.0–34.0)
MCHC: 33.5 g/dL (ref 30.0–36.0)
MCV: 94.2 fL (ref 78.0–100.0)
Platelets: 247 10*3/uL (ref 150–400)
RBC: 4.15 MIL/uL (ref 3.87–5.11)
RDW: 13 % (ref 11.5–15.5)
WBC: 5.3 10*3/uL (ref 4.0–10.5)

## 2015-03-01 MED ORDER — HYDROCODONE-ACETAMINOPHEN 5-325 MG PO TABS: 1.0000 | ORAL_TABLET | Freq: Four times a day (QID) | ORAL | Status: DC | PRN

## 2015-03-01 MED ORDER — FENTANYL CITRATE (PF) 100 MCG/2ML IJ SOLN
50.0000 ug | Freq: Once | INTRAMUSCULAR | Status: AC
  Administered 2015-03-01: 50 ug via INTRAVENOUS
  Filled 2015-03-01: qty 2

## 2015-03-01 NOTE — ED Notes (Signed)
Pt left at this time with all belongings.  

## 2015-03-01 NOTE — ED Notes (Signed)
Pt reports onset 45 minutes ago pt was running with dog, dog ran into her knocking her down on hill and pt fell on right arm.  Swelling noted to right wrist, unable to move right thumb.

## 2015-03-01 NOTE — Progress Notes (Signed)
Orthopedic Tech Progress Note Patient Details:  [name] [name] [birth year] [id number]  Ortho Devices Type of Ortho Device: Ace wrap, Arm sling, Sugartong splint Ortho Device/Splint Location: RUE Ortho Device/Splint Interventions: Ordered, Application   Braulio Bosch 03/01/2015, 10:02 PM

## 2015-03-01 NOTE — Discharge Instructions (Signed)
1. Medications: vicodin for pain, usual home medications 2. Treatment: rest, drink plenty of fluids,  Keep cast dry and clean, use ice and elevate arm 3. Follow Up: Please return to the ED at 8am tomorrow morning.  When you arrive, please tell them that you are scheduled for surgery with Dr. Caralyn Guile and he is expecting you in the operating room.  You do not need to check in to the emergency room.  They will help you find the operating room.      Cast or Splint Care Casts and splints support injured limbs and keep bones from moving while they heal. It is important to care for your cast or splint at home.  HOME CARE INSTRUCTIONS  Keep the cast or splint uncovered during the drying period. It can take 24 to 48 hours to dry if it is made of plaster. A fiberglass cast will dry in less than 1 hour.  Do not rest the cast on anything harder than a pillow for the first 24 hours.  Do not put weight on your injured limb or apply pressure to the cast until your health care provider gives you permission.  Keep the cast or splint dry. Wet casts or splints can lose their shape and may not support the limb as well. A wet cast that has lost its shape can also create harmful pressure on your skin when it dries. Also, wet skin can become infected.  Cover the cast or splint with a plastic bag when bathing or when out in the rain or snow. If the cast is on the trunk of the body, take sponge baths until the cast is removed.  If your cast does become wet, dry it with a towel or a blow dryer on the cool setting only.  Keep your cast or splint clean. Soiled casts may be wiped with a moistened cloth.  Do not place any hard or soft foreign objects under your cast or splint, such as cotton, toilet paper, lotion, or powder.  Do not try to scratch the skin under the cast with any object. The object could get stuck inside the cast. Also, scratching could lead to an infection. If itching is a problem, use a blow dryer on  a cool setting to relieve discomfort.  Do not trim or cut your cast or remove padding from inside of it.  Exercise all joints next to the injury that are not immobilized by the cast or splint. For example, if you have a long leg cast, exercise the hip joint and toes. If you have an arm cast or splint, exercise the shoulder, elbow, thumb, and fingers.  Elevate your injured arm or leg on 1 or 2 pillows for the first 1 to 3 days to decrease swelling and pain.It is best if you can comfortably elevate your cast so it is higher than your heart. SEEK MEDICAL CARE IF:   Your cast or splint cracks.  Your cast or splint is too tight or too loose.  You have unbearable itching inside the cast.  Your cast becomes wet or develops a soft spot or area.  You have a bad smell coming from inside your cast.  You get an object stuck under your cast.  Your skin around the cast becomes red or raw.  You have new pain or worsening pain after the cast has been applied. SEEK IMMEDIATE MEDICAL CARE IF:   You have fluid leaking through the cast.  You are unable to move your  fingers or toes.  You have discolored (blue or white), cool, painful, or very swollen fingers or toes beyond the cast.  You have tingling or numbness around the injured area.  You have severe pain or pressure under the cast.  You have any difficulty with your breathing or have shortness of breath.  You have chest pain. Document Released: 07/02/2000 Document Revised: 04/25/2013 Document Reviewed: 01/11/2013 [facility] Patient Information 2015 [place], [place]. This information is not intended to replace advice given to you by your health care provider. Make sure you discuss any questions you have with your health care provider.

## 2015-03-01 NOTE — ED Provider Notes (Signed)
CSN: [id number]     Arrival date & time 03/01/15  1918 History  This chart was scribed for Abigail Butts, PA-C, working with Leonard Schwartz, MD by Starleen Arms, ED Scribe. This patient was seen in room TR10C/TR10C and the patient's care was started at 7:35 PM.   Chief Complaint  Patient presents with  . Hand Injury   The history is provided by the patient. No language interpreter was used.   HPI Comments: [name] [name] is a 74 y.o. female with hx of HTN, HLT who presents to the Emergency Department complaining of a right wrist pain radiating up to the shoulder with associated swelling and decreased sensation to the palm of the hand onset 1 hour ago.  The patient reports she was playing fetch with her dog when the dog ran into her, knocking her down a 10 ft hill, and causing her to land on her right wrist.  She denies history of DM.  She denies use of anti-coagulants or ASA.  She denies neck pain, back pain, head trauma, elbow pain. Pt is right hand dominant.    Pt last ate at 11:30 am today.   Past Medical History  Diagnosis Date  . Hypertension   . Hyperlipemia   . Stroke 1/12    rt sides weakness-lasted 36hr  . Arthritis   . Depression   . Anxiety    Past Surgical History  Procedure Laterality Date  . Dilation and curettage of uterus    . Diagnostic laparoscopy      ovarian cyst  . Abdominal hysterectomy    . Appendectomy    . Tonsillectomy    . Colonoscopy    . Trigger finger release  05/30/2012    Procedure: RELEASE TRIGGER FINGER/A-1 PULLEY;  Surgeon: Cammie Sickle., MD;  Location: [place];  Service: Orthopedics;  Laterality: Left;  Left Long, Ring, and Small finger A-1 Pulley Release   History reviewed. No pertinent family history. Social History  Substance Use Topics  . Smoking status: Never Smoker   . Smokeless tobacco: None  . Alcohol Use: Yes     Comment: rare   OB History    No data available     Review of Systems   Constitutional: Negative for fever and chills.  Gastrointestinal: Negative for nausea and vomiting.  Musculoskeletal: Positive for joint swelling and arthralgias. Negative for back pain, neck pain and neck stiffness.  Skin: Negative for wound.  Neurological: Negative for numbness.  Hematological: Does not bruise/bleed easily.  Psychiatric/Behavioral: The patient is not nervous/anxious.   All other systems reviewed and are negative.     Allergies  Codeine  Home Medications   Prior to Admission medications   Medication Sig Start Date End Date Taking? Authorizing Provider  aspirin 325 MG tablet Take 325 mg by mouth daily.    Historical Provider, MD  FLUoxetine (PROZAC) 40 MG capsule Take 40 mg by mouth daily.    Historical Provider, MD  hydrochlorothiazide (MICROZIDE) 12.5 MG capsule Take 12.5 mg by mouth daily.    Historical Provider, MD  HYDROcodone-acetaminophen (NORCO/VICODIN) 5-325 MG per tablet Take 1-2 tablets by mouth every 6 (six) hours as needed for moderate pain or severe pain. 03/01/15   Tiara Bartoli, PA-C  lisinopril (PRINIVIL,ZESTRIL) 10 MG tablet Take 10 mg by mouth daily.    Historical Provider, MD  simvastatin (ZOCOR) 40 MG tablet Take 40 mg by mouth every morning.    Historical Provider, MD   BP 138/79  mmHg  Pulse 82  Temp(Src) 98 F (36.7 C) (Oral)  Resp 18  Ht 5\' 3"  (1.6 m)  Wt 160 lb (72.576 kg)  BMI 28.35 kg/m2  SpO2 100% Physical Exam  Constitutional: She appears well-developed and well-nourished. No distress.  HENT:  Head: Normocephalic and atraumatic.  Eyes: Conjunctivae are normal.  Neck: Normal range of motion.  Cardiovascular: Normal rate, regular rhythm and intact distal pulses.   Capillary refill < 3 sec  Pulmonary/Chest: Effort normal and breath sounds normal.  Musculoskeletal: She exhibits tenderness. She exhibits no edema.  ROM: FROM of the right elbow Deformity of the right wrist visible and palpable - no ROM of the wrist.    Limited ROM of the fingers of the right hand  Neurological: She is alert. Coordination normal.  Sensation intact to dull and sharp with subjective decrease in the palm, but sensation present Strength: decreased grip strength in the right hand and wrist.  5/5 in the right elbow and shoulder  Skin: Skin is warm and dry. She is not diaphoretic.  No tenting of the skin  Psychiatric: She has a normal mood and affect.  Nursing note and vitals reviewed.   ED Course  Procedures (including critical care time)  DIAGNOSTIC STUDIES: Oxygen Saturation is 95% on RA, normal by my interpretation.    COORDINATION OF CARE:  7:41 PM Discussed treatment plan with patient at bedside.  Patient acknowledges and agrees with plan.    Labs Review Labs Reviewed  BASIC METABOLIC PANEL - Abnormal; Notable for the following:    CO2 17 (*)    All other components within normal limits  CBC    Imaging Review Dg Wrist Complete Right  03/01/2015   CLINICAL DATA:  Right wrist pain following fall  EXAM: RIGHT WRIST - COMPLETE 3+ VIEW  COMPARISON:  None.  FINDINGS: Comminuted radial fracture is noted in the metaphysis with impaction and angulation at the fracture site. No definitive ulnar fracture is seen. No carpal bone fracture is noted.  IMPRESSION: Comminuted distal radial fracture   Electronically Signed   By: Inez Catalina M.D.   On: 03/01/2015 20:10   Dg Hand Complete Right  03/01/2015   CLINICAL DATA:  Right hand pain following fall, initial encounter  EXAM: RIGHT HAND - COMPLETE 3+ VIEW  COMPARISON:  None.  FINDINGS: There is a comminuted distal radial fracture with impaction and posterior angulation at the fracture site. No definitive ulnar fracture is seen. No fractures are noted within the hand. Mild degenerative changes in the interphalangeal joints are seen.  IMPRESSION: Distal radial fracture.   Electronically Signed   By: Inez Catalina M.D.   On: 03/01/2015 20:09   I personally reviewed and evaluated  these images and lab results as part of my medical decision-making.   EKG Interpretation None       ED ECG REPORT   Date: 03/02/2015  Rate: 50  Rhythm: sinus bradycardia  QRS Axis: normal  Intervals: normal  ST/T Wave abnormalities: normal  Conduction Disutrbances:none  Narrative Interpretation: nonischemic ECG, no old for comparison  Old EKG Reviewed: none available  I have personally reviewed the EKG tracing and agree with the computerized printout as noted.   MDM   Final diagnoses:  Distal radius fracture, right, closed, initial encounter   Titus Mould presents with right wrist pain after fall.  Pt with obvious deformity of the right wrist.  X-rays pending.    8:15 PM Distal radius fracture noted on x-rays.  Will discuss with Dr. Caralyn Guile.  9:45PM Dr. Caralyn Guile has looked at patient's x-rays and will take her to surgery tomorrow morning at 8 AM. CBC, BMP and EKG pending for preop.  Patient's pain is controlled at this time.  10:18 PM Sugar tong splint placed with capillary refill present before and after.  Pt is to be NPO after midnight.    BP 138/79 mmHg  Pulse 82  Temp(Src) 98 F (36.7 C) (Oral)  Resp 18  Ht 5\' 3"  (1.6 m)  Wt 160 lb (72.576 kg)  BMI 28.35 kg/m2  SpO2 100%  I personally performed the services described in this documentation, which was scribed in my presence. The recorded information has been reviewed and is accurate.   Jarrett Soho Petrea Fredenburg, PA-C 03/02/15 4540  Leonard Schwartz, MD 03/02/15 [phone]

## 2015-03-02 ENCOUNTER — Encounter (HOSPITAL_COMMUNITY): Payer: Self-pay | Admitting: *Deleted

## 2015-03-02 ENCOUNTER — Ambulatory Visit (HOSPITAL_COMMUNITY)
Admission: RE | Admit: 2015-03-02 | Discharge: 2015-03-02 | Disposition: A | Discharge status: Home / Self Care | Payer: Medicare Other | Source: Ambulatory Visit | Attending: Orthopedic Surgery | Admitting: Orthopedic Surgery

## 2015-03-02 ENCOUNTER — Ambulatory Visit (HOSPITAL_COMMUNITY): Payer: Medicare Other | Admitting: Anesthesiology

## 2015-03-02 ENCOUNTER — Encounter (HOSPITAL_COMMUNITY)
Admission: RE | Disposition: A | Discharge status: Home / Self Care | Payer: Self-pay | Source: Ambulatory Visit | Attending: Orthopedic Surgery

## 2015-03-02 DIAGNOSIS — Y92828 Other wilderness area as the place of occurrence of the external cause: Secondary | ICD-10-CM | POA: Diagnosis not present

## 2015-03-02 DIAGNOSIS — E785 Hyperlipidemia, unspecified: Secondary | ICD-10-CM | POA: Diagnosis not present

## 2015-03-02 DIAGNOSIS — W1781XA Fall down embankment (hill), initial encounter: Secondary | ICD-10-CM | POA: Insufficient documentation

## 2015-03-02 DIAGNOSIS — S52571A Other intraarticular fracture of lower end of right radius, initial encounter for closed fracture: Secondary | ICD-10-CM | POA: Diagnosis not present

## 2015-03-02 DIAGNOSIS — I10 Essential (primary) hypertension: Secondary | ICD-10-CM | POA: Insufficient documentation

## 2015-03-02 DIAGNOSIS — M1990 Unspecified osteoarthritis, unspecified site: Secondary | ICD-10-CM | POA: Insufficient documentation

## 2015-03-02 DIAGNOSIS — F419 Anxiety disorder, unspecified: Secondary | ICD-10-CM | POA: Diagnosis not present

## 2015-03-02 DIAGNOSIS — Y93K9 Activity, other involving animal care: Secondary | ICD-10-CM | POA: Insufficient documentation

## 2015-03-02 DIAGNOSIS — Z7982 Long term (current) use of aspirin: Secondary | ICD-10-CM | POA: Insufficient documentation

## 2015-03-02 DIAGNOSIS — F329 Major depressive disorder, single episode, unspecified: Secondary | ICD-10-CM | POA: Insufficient documentation

## 2015-03-02 DIAGNOSIS — Z79899 Other long term (current) drug therapy: Secondary | ICD-10-CM | POA: Insufficient documentation

## 2015-03-02 DIAGNOSIS — Z885 Allergy status to narcotic agent status: Secondary | ICD-10-CM | POA: Insufficient documentation

## 2015-03-02 DIAGNOSIS — Z8673 Personal history of transient ischemic attack (TIA), and cerebral infarction without residual deficits: Secondary | ICD-10-CM | POA: Diagnosis not present

## 2015-03-02 HISTORY — PX: ORIF WRIST FRACTURE: SHX2133

## 2015-03-02 SURGERY — OPEN REDUCTION INTERNAL FIXATION (ORIF) WRIST FRACTURE
Anesthesia: General | Laterality: Right

## 2015-03-02 MED ORDER — PHENYLEPHRINE HCL 10 MG/ML IJ SOLN
INTRAMUSCULAR | Status: DC | PRN
  Administered 2015-03-02: 40 ug via INTRAVENOUS

## 2015-03-02 MED ORDER — ONDANSETRON HCL 4 MG PO TABS: 4.0000 mg | ORAL_TABLET | Freq: Three times a day (TID) | ORAL | Status: DC | PRN

## 2015-03-02 MED ORDER — LIDOCAINE-EPINEPHRINE (PF) 1.5 %-1:200000 IJ SOLN
INTRAMUSCULAR | Status: DC | PRN
  Administered 2015-03-02: 10 mL via PERINEURAL

## 2015-03-02 MED ORDER — OXYCODONE-ACETAMINOPHEN 5-325 MG PO TABS: 1.0000 | ORAL_TABLET | ORAL | Status: DC | PRN

## 2015-03-02 MED ORDER — METHOCARBAMOL 500 MG PO TABS: 500.0000 mg | ORAL_TABLET | Freq: Four times a day (QID) | ORAL | Status: DC

## 2015-03-02 MED ORDER — CHLORHEXIDINE GLUCONATE 4 % EX LIQD: 60.0000 mL | Freq: Once | CUTANEOUS | Status: DC

## 2015-03-02 MED ORDER — GLYCOPYRROLATE 0.2 MG/ML IJ SOLN
INTRAMUSCULAR | Status: DC | PRN
  Administered 2015-03-02: 0.2 mg via INTRAVENOUS

## 2015-03-02 MED ORDER — LIDOCAINE HCL (CARDIAC) 20 MG/ML IV SOLN
INTRAVENOUS | Status: DC | PRN
  Administered 2015-03-02: 50 mg via INTRAVENOUS

## 2015-03-02 MED ORDER — PROPOFOL 10 MG/ML IV BOLUS
INTRAVENOUS | Status: DC | PRN
  Administered 2015-03-02: 150 mg via INTRAVENOUS

## 2015-03-02 MED ORDER — BUPIVACAINE HCL (PF) 0.5 % IJ SOLN
INTRAMUSCULAR | Status: DC | PRN
  Administered 2015-03-02: 20 mL via PERINEURAL

## 2015-03-02 MED ORDER — VITAMIN C 500 MG PO TABS: 500.0000 mg | ORAL_TABLET | Freq: Every day | ORAL | Status: AC

## 2015-03-02 MED ORDER — DOCUSATE SODIUM 100 MG PO CAPS: 100.0000 mg | ORAL_CAPSULE | Freq: Two times a day (BID) | ORAL | Status: DC

## 2015-03-02 MED ORDER — 0.9 % SODIUM CHLORIDE (POUR BTL) OPTIME
TOPICAL | Status: DC | PRN
  Administered 2015-03-02: 300 mL

## 2015-03-02 MED ORDER — LACTATED RINGERS IV SOLN
INTRAVENOUS | Status: DC
  Administered 2015-03-02: 09:00:00 via INTRAVENOUS

## 2015-03-02 MED ORDER — MIDAZOLAM HCL 2 MG/2ML IJ SOLN
INTRAMUSCULAR | Status: AC
  Administered 2015-03-02: 2 mg
  Filled 2015-03-02: qty 2

## 2015-03-02 MED ORDER — KETOROLAC TROMETHAMINE 30 MG/ML IJ SOLN: 15.0000 mg | Freq: Once | INTRAMUSCULAR | Status: DC

## 2015-03-02 MED ORDER — METHOCARBAMOL 500 MG PO TABS
500.0000 mg | ORAL_TABLET | Freq: Four times a day (QID) | ORAL | Status: DC
Start: 2015-03-02 — End: 2019-06-28

## 2015-03-02 MED ORDER — PROMETHAZINE HCL 25 MG/ML IJ SOLN
6.2500 mg | INTRAMUSCULAR | Status: DC | PRN
Start: 2015-03-02 — End: 2015-03-02

## 2015-03-02 MED ORDER — EPHEDRINE SULFATE 50 MG/ML IJ SOLN
INTRAMUSCULAR | Status: DC | PRN
  Administered 2015-03-02 (×2): 10 mg via INTRAVENOUS

## 2015-03-02 MED ORDER — CEFAZOLIN SODIUM-DEXTROSE 2-3 GM-% IV SOLR
2.0000 g | INTRAVENOUS | Status: AC
  Administered 2015-03-02: 2 g via INTRAVENOUS
  Filled 2015-03-02: qty 50

## 2015-03-02 MED ORDER — HYDROMORPHONE HCL 1 MG/ML IJ SOLN: 0.2500 mg | INTRAMUSCULAR | Status: DC | PRN

## 2015-03-02 MED ORDER — LACTATED RINGERS IV SOLN
INTRAVENOUS | Status: DC | PRN
  Administered 2015-03-02 (×2): via INTRAVENOUS

## 2015-03-02 MED ORDER — FENTANYL CITRATE (PF) 100 MCG/2ML IJ SOLN
INTRAMUSCULAR | Status: AC
  Administered 2015-03-02: 100 ug
  Filled 2015-03-02: qty 2

## 2015-03-02 MED ORDER — ONDANSETRON HCL 4 MG/2ML IJ SOLN
INTRAMUSCULAR | Status: DC | PRN
  Administered 2015-03-02: 4 mg via INTRAVENOUS

## 2015-03-02 SURGICAL SUPPLY — 58 items
BANDAGE ELASTIC 3 VELCRO ST LF (GAUZE/BANDAGES/DRESSINGS) ×3 IMPLANT
BANDAGE ELASTIC 4 VELCRO ST LF (GAUZE/BANDAGES/DRESSINGS) ×3 IMPLANT
BIT DRILL 2.2 SS TIBIAL (BIT) ×3 IMPLANT
BLADE SURG ROTATE 9660 (MISCELLANEOUS) IMPLANT
BNDG ESMARK 4X9 LF (GAUZE/BANDAGES/DRESSINGS) ×3 IMPLANT
BNDG GAUZE ELAST 4 BULKY (GAUZE/BANDAGES/DRESSINGS) ×3 IMPLANT
CORDS BIPOLAR (ELECTRODE) ×3 IMPLANT
COVER SURGICAL LIGHT HANDLE (MISCELLANEOUS) ×3 IMPLANT
CUFF TOURNIQUET SINGLE 18IN (TOURNIQUET CUFF) ×3 IMPLANT
CUFF TOURNIQUET SINGLE 24IN (TOURNIQUET CUFF) IMPLANT
DRAIN TLS ROUND 10FR (DRAIN) IMPLANT
DRAPE OEC MINIVIEW 54X84 (DRAPES) ×3 IMPLANT
DRAPE SURG 17X11 SM STRL (DRAPES) ×3 IMPLANT
DRSG ADAPTIC 3X8 NADH LF (GAUZE/BANDAGES/DRESSINGS) ×3 IMPLANT
ELECT REM PT RETURN 9FT ADLT (ELECTROSURGICAL)
ELECTRODE REM PT RTRN 9FT ADLT (ELECTROSURGICAL) IMPLANT
GAUZE SPONGE 4X4 12PLY STRL (GAUZE/BANDAGES/DRESSINGS) ×3 IMPLANT
GLOVE BIOGEL PI IND STRL 8.5 (GLOVE) ×1 IMPLANT
GLOVE BIOGEL PI INDICATOR 8.5 (GLOVE) ×2
GLOVE SURG ORTHO 8.0 STRL STRW (GLOVE) ×3 IMPLANT
GOWN STRL REUS W/ TWL LRG LVL3 (GOWN DISPOSABLE) ×1 IMPLANT
GOWN STRL REUS W/ TWL XL LVL3 (GOWN DISPOSABLE) ×1 IMPLANT
GOWN STRL REUS W/TWL LRG LVL3 (GOWN DISPOSABLE) ×2
GOWN STRL REUS W/TWL XL LVL3 (GOWN DISPOSABLE) ×2
K-WIRE 1.6 (WIRE) ×2
K-WIRE FX5X1.6XNS BN SS (WIRE) ×1
KIT BASIN OR (CUSTOM PROCEDURE TRAY) ×3 IMPLANT
KIT ROOM TURNOVER OR (KITS) ×3 IMPLANT
KWIRE FX5X1.6XNS BN SS (WIRE) ×1 IMPLANT
MANIFOLD NEPTUNE II (INSTRUMENTS) ×3 IMPLANT
NEEDLE HYPO 25X1 1.5 SAFETY (NEEDLE) IMPLANT
NS IRRIG 1000ML POUR BTL (IV SOLUTION) ×3 IMPLANT
PACK ORTHO EXTREMITY (CUSTOM PROCEDURE TRAY) ×3 IMPLANT
PAD ARMBOARD 7.5X6 YLW CONV (MISCELLANEOUS) ×6 IMPLANT
PAD CAST 4YDX4 CTTN HI CHSV (CAST SUPPLIES) ×1 IMPLANT
PADDING CAST COTTON 4X4 STRL (CAST SUPPLIES) ×2
PEG LOCKING SMOOTH 2.2X16 (Screw) ×6 IMPLANT
PEG LOCKING SMOOTH 2.2X18 (Peg) ×3 IMPLANT
PEG LOCKING SMOOTH 2.2X20 (Screw) ×12 IMPLANT
PLATE DVR CROSSLOCK STD RT (Plate) ×3 IMPLANT
SCREW LOCK 12X2.7X 3 LD (Screw) ×2 IMPLANT
SCREW LOCK 14X2.7X 3 LD TPR (Screw) ×3 IMPLANT
SCREW LOCKING 2.7X12MM (Screw) ×4 IMPLANT
SCREW LOCKING 2.7X14 (Screw) ×6 IMPLANT
SOAP 2 % CHG 4 OZ (WOUND CARE) ×3 IMPLANT
SPLINT FIBERGLASS 3X35 (CAST SUPPLIES) ×3 IMPLANT
SPONGE GAUZE 4X4 12PLY STER LF (GAUZE/BANDAGES/DRESSINGS) ×3 IMPLANT
SUT PROLENE 4 0 PS 2 18 (SUTURE) IMPLANT
SUT VIC AB 2-0 FS1 27 (SUTURE) ×3 IMPLANT
SUT VICRYL 4-0 PS2 18IN ABS (SUTURE) ×3 IMPLANT
SUT VICRYL RAPIDE 4/0 PS 2 (SUTURE) ×3 IMPLANT
SYR CONTROL 10ML LL (SYRINGE) IMPLANT
SYSTEM CHEST DRAIN TLS 7FR (DRAIN) IMPLANT
TOWEL OR 17X24 6PK STRL BLUE (TOWEL DISPOSABLE) ×3 IMPLANT
TOWEL OR 17X26 10 PK STRL BLUE (TOWEL DISPOSABLE) ×3 IMPLANT
TUBE CONNECTING 12'X1/4 (SUCTIONS) ×1
TUBE CONNECTING 12X1/4 (SUCTIONS) ×2 IMPLANT
WATER STERILE IRR 1000ML POUR (IV SOLUTION) ×3 IMPLANT

## 2015-03-02 NOTE — Anesthesia Postprocedure Evaluation (Signed)
  Anesthesia Post-op Note  Patient: [name] [name]  Procedure(s) Performed: Procedure(s) (LRB): OPEN REDUCTION INTERNAL FIXATION (ORIF) WRIST FRACTURE (Right)  Patient Location: PACU  Anesthesia Type: GA combined with regional for post-op pain  Level of Consciousness: awake and alert   Airway and Oxygen Therapy: Patient Spontanous Breathing  Post-op Pain: mild  Post-op Assessment: Post-op Vital signs reviewed, Patient's Cardiovascular Status Stable, Respiratory Function Stable, Patent Airway and No signs of Nausea or vomiting  Last Vitals:  Filed Vitals:   03/02/15 1126  BP: 126/53  Pulse: 82  Temp:   Resp: 12    Post-op Vital Signs: stable   Complications: No apparent anesthesia complications

## 2015-03-02 NOTE — Transfer of Care (Signed)
Immediate Anesthesia Transfer of Care Note  Patient: [name] [name]  Procedure(s) Performed: Procedure(s): OPEN REDUCTION INTERNAL FIXATION (ORIF) WRIST FRACTURE (Right)  Patient Location: PACU  Anesthesia Type:General  Level of Consciousness: awake, alert  and oriented  Airway & Oxygen Therapy: Patient Spontanous Breathing and Patient connected to nasal cannula oxygen  Post-op Assessment: Report given to RN, Post -op Vital signs reviewed and stable and Patient moving all extremities X 4  Post vital signs: Reviewed and stable  Last Vitals: There were no vitals filed for this visit.  Complications: No apparent anesthesia complications

## 2015-03-02 NOTE — Discharge Instructions (Signed)
KEEP BANDAGE CLEAN AND DRY °CALL OFFICE FOR F/U APPT [phone] IN 14 DAYS °DR Samone Guhl CELL [phone] °KEEP HAND ELEVATED ABOVE HEART °OK TO APPLY ICE TO OPERATIVE AREA °CONTACT OFFICE IF ANY WORSENING PAIN OR CONCERNS. °

## 2015-03-02 NOTE — Anesthesia Preprocedure Evaluation (Addendum)
Anesthesia Evaluation  Patient identified by MRN, date of birth, ID band Patient awake    Reviewed: Allergy & Precautions, NPO status , Patient's Chart, lab work & pertinent test results  Airway Mallampati: II  TM Distance: >3 FB Neck ROM: Full    Dental no notable dental hx. (+) Teeth Intact, Dental Advidsory Given   Pulmonary neg pulmonary ROS,  breath sounds clear to auscultation  Pulmonary exam normal       Cardiovascular hypertension, Normal cardiovascular examRhythm:Regular Rate:Normal     Neuro/Psych PSYCHIATRIC DISORDERS Depression CVA, No Residual Symptoms    GI/Hepatic negative GI ROS, Neg liver ROS,   Endo/Other  negative endocrine ROS  Renal/GU negative Renal ROS  negative genitourinary   Musculoskeletal negative musculoskeletal ROS (+)   Abdominal   Peds negative pediatric ROS (+)  Hematology negative hematology ROS (+)   Anesthesia Other Findings   Reproductive/Obstetrics negative OB ROS                            Anesthesia Physical Anesthesia Plan  ASA: III and emergent  Anesthesia Plan: General   Post-op Pain Management: GA combined w/ Regional for post-op pain   Induction: Intravenous  Airway Management Planned: LMA and Oral ETT  Additional Equipment:   Intra-op Plan:   Post-operative Plan: Extubation in OR  Informed Consent: I have reviewed the patients History and Physical, chart, labs and discussed the procedure including the risks, benefits and alternatives for the proposed anesthesia with the patient or authorized representative who has indicated his/her understanding and acceptance.   Dental advisory given and Dental Advisory Given  Plan Discussed with: CRNA, Surgeon and Anesthesiologist  Anesthesia Plan Comments:        Anesthesia Quick Evaluation

## 2015-03-02 NOTE — H&P (Signed)
[name] [name] is an 74 y.o. female.   Chief Complaint: RIGHT WRIST INJURY HPI: [name] [name] is a 74 y.o. female with hx of HTN, HLT who presents to the Emergency Department complaining of a right wrist pain radiating up to the shoulder with associated swelling and decreased sensation to the palm of the hand onset 1 hour ago. The patient reports she was playing fetch with her dog when the dog ran into her, knocking her down a 10 ft hill, and causing her to land on her right wrist. She denies history of DM. She denies use of anti-coagulants or ASA. She denies neck pain, back pain, head trauma, elbow pain. Pt is right hand dominant.   Past Medical History  Diagnosis Date  . Hypertension   . Hyperlipemia   . Stroke 1/12    rt sides weakness-lasted 36hr  . Arthritis   . Depression   . Anxiety     Past Surgical History  Procedure Laterality Date  . Dilation and curettage of uterus    . Diagnostic laparoscopy      ovarian cyst  . Abdominal hysterectomy    . Appendectomy    . Tonsillectomy    . Colonoscopy    . Trigger finger release  05/30/2012    Procedure: RELEASE TRIGGER FINGER/A-1 PULLEY;  Surgeon: Cammie Sickle., MD;  Location: [place];  Service: Orthopedics;  Laterality: Left;  Left Long, Ring, and Small finger A-1 Pulley Release    History reviewed. No pertinent family history. Social History:  reports that she has never smoked. She does not have any smokeless tobacco history on file. She reports that she drinks alcohol. She reports that she does not use illicit drugs.  Allergies:  Allergies  Allergen Reactions  . Codeine Nausea And Vomiting    Medications Prior to Admission  Medication Sig Dispense Refill  . atorvastatin (LIPITOR) 40 MG tablet Take 40 mg by mouth daily.    . hydrochlorothiazide (MICROZIDE) 12.5 MG capsule Take 12.5 mg by mouth daily.    Marland Kitchen losartan (COZAAR) 50 MG tablet Take 50 mg by mouth daily.    Marland Kitchen aspirin 325 MG tablet  Take 325 mg by mouth daily.    Marland Kitchen FLUoxetine (PROZAC) 40 MG capsule Take 40 mg by mouth daily.    Marland Kitchen HYDROcodone-acetaminophen (NORCO/VICODIN) 5-325 MG per tablet Take 1-2 tablets by mouth every 6 (six) hours as needed for moderate pain or severe pain. 15 tablet 0  . lisinopril (PRINIVIL,ZESTRIL) 10 MG tablet Take 10 mg by mouth daily.    . simvastatin (ZOCOR) 40 MG tablet Take 40 mg by mouth every morning.      Results for orders placed or performed during the hospital encounter of 03/01/15 (from the past 48 hour(s))  CBC     Status: None   Collection Time: 03/01/15  8:30 PM  Result Value Ref Range   WBC 5.3 4.0 - 10.5 K/uL   RBC 4.15 3.87 - 5.11 MIL/uL   Hemoglobin 13.1 12.0 - 15.0 g/dL   HCT 39.1 36.0 - 46.0 %   MCV 94.2 78.0 - 100.0 fL   MCH 31.6 26.0 - 34.0 pg   MCHC 33.5 30.0 - 36.0 g/dL   RDW 13.0 11.5 - 15.5 %   Platelets 247 150 - 400 K/uL  Basic metabolic panel     Status: Abnormal   Collection Time: 03/01/15  8:30 PM  Result Value Ref Range   Sodium 135 135 - 145  mmol/L   Potassium 4.6 3.5 - 5.1 mmol/L   Chloride 104 101 - 111 mmol/L   CO2 17 (L) 22 - 32 mmol/L   Glucose, Bld 82 65 - 99 mg/dL   BUN 19 6 - 20 mg/dL   Creatinine, Ser 0.81 0.44 - 1.00 mg/dL   Calcium 9.4 8.9 - 10.3 mg/dL   GFR calc non Af Amer >60 >60 mL/min   GFR calc Af Amer >60 >60 mL/min    Comment: (NOTE) The eGFR has been calculated using the CKD EPI equation. This calculation has not been validated in all clinical situations. eGFR's persistently <60 mL/min signify possible Chronic Kidney Disease.    Anion gap 14 5 - 15   Dg Wrist Complete Right  03/01/2015   CLINICAL DATA:  Right wrist pain following fall  EXAM: RIGHT WRIST - COMPLETE 3+ VIEW  COMPARISON:  None.  FINDINGS: Comminuted radial fracture is noted in the metaphysis with impaction and angulation at the fracture site. No definitive ulnar fracture is seen. No carpal bone fracture is noted.  IMPRESSION: Comminuted distal radial fracture    Electronically Signed   By: Inez Catalina M.D.   On: 03/01/2015 20:10   Dg Hand Complete Right  03/01/2015   CLINICAL DATA:  Right hand pain following fall, initial encounter  EXAM: RIGHT HAND - COMPLETE 3+ VIEW  COMPARISON:  None.  FINDINGS: There is a comminuted distal radial fracture with impaction and posterior angulation at the fracture site. No definitive ulnar fracture is seen. No fractures are noted within the hand. Mild degenerative changes in the interphalangeal joints are seen.  IMPRESSION: Distal radial fracture.   Electronically Signed   By: Inez Catalina M.D.   On: 03/01/2015 20:09    ROSNO RECENT ILLNESSES OR HOSPITALIZATIONS  Height 5' 3" (1.6 m), weight 70.903 kg (156 lb 5 oz). Physical Exam   General Appearance:  Alert, cooperative, no distress, appears stated age  Head:  Normocephalic, without obvious abnormality, atraumatic  Eyes:  Pupils equal, conjunctiva/corneas clear,         Throat: Lips, mucosa, and tongue normal; teeth and gums normal  Neck: No visible masses     Lungs:   respirations unlabored  Chest Wall:  No tenderness or deformity  Heart:  Regular rate and rhythm,  Abdomen:   Soft, non-tender,         Extremities: RUE: SPLINT IN PLACE, FINGERS WARM WELL PERFUSED  Pulses: 2+ and symmetric  Skin: Skin color, texture, turgor normal, no rashes or lesions     Neurologic: Normal   Assessment/Plan RIGHT DISTAL RADIUS FRACTURE, DISPLACED INTRAARTICULAR  RIGHT DISTAL RADIUS OPEN REDUCTION AND INTERNAL FIXATION AND REPAIR AS INDICATED  R/B/A DISCUSSED WITH PT IN HOSPITAL.  PT VOICED UNDERSTANDING OF PLAN CONSENT SIGNED DAY OF SURGERY PT SEEN AND EXAMINED PRIOR TO OPERATIVE PROCEDURE/DAY OF SURGERY SITE MARKED. QUESTIONS ANSWERED WILL GO HOME FOLLOWING SURGERY  WE ARE PLANNING SURGERY FOR YOUR UPPER EXTREMITY. THE RISKS AND BENEFITS OF SURGERY INCLUDE BUT NOT LIMITED TO BLEEDING INFECTION, DAMAGE TO NEARBY NERVES ARTERIES TENDONS, FAILURE OF SURGERY TO  ACCOMPLISH ITS INTENDED GOALS, PERSISTENT SYMPTOMS AND NEED FOR FURTHER SURGICAL INTERVENTION. WITH THIS IN MIND WE WILL PROCEED. I HAVE DISCUSSED WITH THE PATIENT THE PRE AND POSTOPERATIVE REGIMEN AND THE DOS AND DON'TS. PT VOICED UNDERSTANDING AND INFORMED CONSENT SIGNED.  Linna Hoff 03/02/2015, 9:48 AM

## 2015-03-02 NOTE — Brief Op Note (Signed)
03/02/2015  9:50 AM  PATIENT:  [name] [name]  74 y.o. female  PRE-OPERATIVE DIAGNOSIS:  right distal radius fracture  POST-OPERATIVE DIAGNOSIS:  * No post-op diagnosis entered *  PROCEDURE:  Procedure(s): OPEN REDUCTION INTERNAL FIXATION (ORIF) WRIST FRACTURE (Right)  SURGEON:  Surgeon(s) and Role:    * Iran Planas, MD - Primary  PHYSICIAN ASSISTANT:   ASSISTANTS: none   ANESTHESIA:   general  EBL:     BLOOD ADMINISTERED:none  DRAINS: none   LOCAL MEDICATIONS USED:  NONE  SPECIMEN:  No Specimen  DISPOSITION OF SPECIMEN:  N/A  COUNTS:  YES  TOURNIQUET:    DICTATION: .Dragon Dictation and Other Dictation: Dictation Number [id number]  PLAN OF CARE: Discharge to home after PACU  PATIENT DISPOSITION:  PACU - hemodynamically stable.   Delay start of Pharmacological VTE agent (>24hrs) due to surgical blood loss or risk of bleeding: not applicable

## 2015-03-02 NOTE — Anesthesia Procedure Notes (Addendum)
Anesthesia Regional Block:  Supraclavicular block  Pre-Anesthetic Checklist: ,, timeout performed, Correct Patient, Correct Site, Correct Laterality, Correct Procedure, Correct Position, site marked, Risks and benefits discussed,  Surgical consent,  Pre-op evaluation,  At surgeon's request and post-op pain management   Prep: chloraprep       Needles:  Injection technique: Single-shot  Needle Type: Echogenic Needle     Needle Length: 9cm 9 cm Needle Gauge: 21 and 21 G    Additional Needles:  Procedures: ultrasound guided (picture in chart) and nerve stimulator Supraclavicular block Narrative:  Injection made incrementally with aspirations every 5 mL.  Performed by: Personally   Additional Notes: Risks, benefits and alternative to block explained extensively.  Patient tolerated procedure well, without complications.   Procedure Name: LMA Insertion Date/Time: 03/02/2015 10:06 AM Performed by: Neldon Newport Pre-anesthesia Checklist: Timeout performed, Patient being monitored, Suction available, Emergency Drugs available and Patient identified Patient Re-evaluated:Patient Re-evaluated prior to inductionOxygen Delivery Method: Circle system utilized Preoxygenation: Pre-oxygenation with 100% oxygen Intubation Type: IV induction Ventilation: Mask ventilation without difficulty LMA: LMA inserted LMA Size: 4.0 Placement Confirmation: positive ETCO2 and breath sounds checked- equal and bilateral Tube secured with: Tape Dental Injury: Teeth and Oropharynx as per pre-operative assessment

## 2015-03-02 NOTE — Progress Notes (Signed)
Orthopedic Tech Progress Note Patient Details:  [name] [name] [birth year] [id number]  Ortho Devices Type of Ortho Device: Arm sling Ortho Device/Splint Interventions: Application   [name] 03/02/2015, 12:03 PM

## 2015-03-02 NOTE — Op Note (Signed)
NAME:  [name], [name]              ACCOUNT NO.:  [id number]  MEDICAL RECORD NO.:  [id number]  LOCATION:  [facility]                         FACILITY:  [facility]  PHYSICIAN:  Linna Hoff IV, M.D.DATE OF BIRTH:  [birth year]  DATE OF PROCEDURE:  03/02/2015 DATE OF DISCHARGE:                              OPERATIVE REPORT   POSTOPERATIVE DIAGNOSIS:  Right wrist intra-articular distal radius fracture, tumor fragments.  POSTOPERATIVE DIAGNOSIS:  Right wrist intra-articular distal radius fracture, tumor fragments.  ATTENDING PHYSICIAN:  Linna Hoff, M.D. who was scrubbed and present for the entire procedure.  ASSISTANT SURGEON:  None.  ANESTHESIA:  General via LMA with supraclavicular block.  SURGICAL PROCEDURE: 1. Open treatment of right wrist intra-articular distal radius     fracture, tumor fragments. 2. Right wrist brachioradialis tendon lengthening and tendon tenotomy. 3. Radiographs, 3 views, right wrist.  SURGICAL INDICATIONS:  [name] [name] is a right-hand-dominant female who sustained a closed injury to her right distal radius.  The patient was seen and evaluated and recommended to undergo the above procedure. Risks, benefits, and alternatives were discussed in detail with the patient.  Signed informed consent was obtained.  Risks include, but not limited to bleeding, infection, damage to nearby nerves, arteries, or tendons, loss of motion of wrist and digits, nonunion, malunion, hardware failure, and need for further surgical intervention.  SURGICAL IMPLANTS:  DVR Crosslock standard.  RADIOGRAPHIC INTERPRETATION:  AP and lateral views of the wrist did show the volar plate fixation in place.  There was good position in both planes.  DESCRIPTION OF PROCEDURE:  The patient was properly identified in the preoperative holding area.  The patient was brought back to the operating room, placed supine on the anesthesia table where general anesthesia was administered.  The patient  tolerated this well. Preoperative antibiotics were given prior to any skin incision.  A well- padded tourniquet placed on the right brachium, sealed with 1000 drape. The right upper extremity was then prepped and draped in normal sterile fashion.  Time-out was called, correct side was identified, and procedure then begun.  Attention was then turned to the right wrist. The limb was elevated and tourniquet insufflated.  Dissection was carried down to skin and subcutaneous tissue.  The FCR sheath was then opened proximally and distally, going through the floor of the FCR sheath.  Dissection was carried down through the sweeping FPL and pronator quadratus was elevated in an L-shaped fashion.  Following this, the fracture site was then opened.  The patient did have intra-articular tumor fragments that were reduced.  An open reduction was then performed.  The volar plate was then applied.  The patient tolerated this well.  Once open reduction was then performed, the volar plate was then applied and held distally with a K-wire.  Position confirmed using mini C-arm.  Once this was carried out, the screw fixation was then carried out at the oblong screw hole proximally.  The mini C-arm confirmed placement.  Following this, distal fixation was then carried out from an ulnar to radial direction with the distal locking pegs. Following this, 4 more cortical shaft screws were then placed.  The wound was then thoroughly irrigated.  The  brachioradialis had been released off the radial styloid.  Tendon tenotomy had been carried out to reduce the radial column of the intra-articular fracture as well as to reduce the intra-articular fracture.  The wound was irrigated. Pronator quadratus was closed with 0 Vicryl.  Subcutaneous tissues closed with 0 Vicryl.  Skin was closed with 4-0 Vicryl repeated. Adaptic dressing and sterile compressive bandage was then applied.  The patient was then placed in a  well-padded sugar-tong splint, extubated, and taken to recovery room in good condition.  Tourniquet deflated. There was good perfusion of the fingers.  POSTPROCEDURAL PLAN:  The patient was discharged to home, to be seen back in the office in approximately 2 weeks for wound check, x-rays, application of short-arm cast, and begin a therapy regimen at the 4-week mark.  Radiographs at each visit.     Melrose Nakayama, M.D.     FWO/MEDQ  D:  03/02/2015  T:  03/02/2015  Job:  [id number]

## 2015-03-03 ENCOUNTER — Encounter (HOSPITAL_COMMUNITY): Payer: Self-pay | Admitting: Orthopedic Surgery

## 2016-02-26 ENCOUNTER — Other Ambulatory Visit: Payer: Self-pay | Admitting: Family Medicine

## 2016-02-26 DIAGNOSIS — M79605 Pain in left leg: Secondary | ICD-10-CM

## 2016-03-01 ENCOUNTER — Ambulatory Visit
Admission: RE | Admit: 2016-03-01 | Discharge: 2016-03-01 | Disposition: A | Discharge status: Home / Self Care | Payer: Medicare Other | Source: Ambulatory Visit | Attending: Family Medicine | Admitting: Family Medicine

## 2016-03-01 DIAGNOSIS — M79605 Pain in left leg: Secondary | ICD-10-CM

## 2016-03-05 ENCOUNTER — Ambulatory Visit
Admission: RE | Admit: 2016-03-05 | Discharge: 2016-03-05 | Disposition: A | Discharge status: Home / Self Care | Payer: Medicare Other | Source: Ambulatory Visit | Attending: Family Medicine | Admitting: Family Medicine

## 2016-03-05 DIAGNOSIS — M79605 Pain in left leg: Secondary | ICD-10-CM

## 2019-02-19 ENCOUNTER — Other Ambulatory Visit: Payer: Self-pay | Admitting: Physician Assistant

## 2019-02-19 DIAGNOSIS — R1310 Dysphagia, unspecified: Secondary | ICD-10-CM

## 2019-02-19 DIAGNOSIS — R1319 Other dysphagia: Secondary | ICD-10-CM

## 2019-02-23 ENCOUNTER — Ambulatory Visit
Admission: RE | Admit: 2019-02-23 | Discharge: 2019-02-23 | Disposition: A | Discharge status: Home / Self Care | Payer: Medicare Other | Source: Ambulatory Visit | Attending: Physician Assistant | Admitting: Physician Assistant

## 2019-02-23 DIAGNOSIS — R1319 Other dysphagia: Secondary | ICD-10-CM

## 2019-02-23 DIAGNOSIS — R1310 Dysphagia, unspecified: Secondary | ICD-10-CM

## 2019-03-22 ENCOUNTER — Ambulatory Visit: Payer: Medicare Other | Admitting: Cardiovascular Disease

## 2019-06-25 NOTE — Progress Notes (Deleted)
Cardiology Office Note:   Date:  06/25/2019  NAME:  [name] [name]    MRN: [id number] DOB:  08-31-1940   PCP:  Carol Ada, MD  Cardiologist:  No primary care provider on file.  Electrophysiologist:  None   Referring MD: Carol Ada, MD   No chief complaint on file. ***  History of Present Illness:   [name] [name] is a 78 y.o. female with a hx of hypertension, hyperlipidemia who is being seen today for the evaluation of hyperlipidemia at the request of Carol Ada, MD.  Past Medical History: Past Medical History:  Diagnosis Date  . Anxiety   . Arthritis   . Depression   . Hyperlipemia   . Hypertension   . Stroke 1/12   rt sides weakness-lasted 36hr    Past Surgical History: Past Surgical History:  Procedure Laterality Date  . ABDOMINAL HYSTERECTOMY    . APPENDECTOMY    . COLONOSCOPY    . DIAGNOSTIC LAPAROSCOPY     ovarian cyst  . DILATION AND CURETTAGE OF UTERUS    . ORIF WRIST FRACTURE Right 03/02/2015   Procedure: OPEN REDUCTION INTERNAL FIXATION (ORIF) WRIST FRACTURE;  Surgeon: Iran Planas, MD;  Location: [place];  Service: Orthopedics;  Laterality: Right;  . TONSILLECTOMY    . TRIGGER FINGER RELEASE  05/30/2012   Procedure: RELEASE TRIGGER FINGER/A-1 PULLEY;  Surgeon: Cammie Sickle., MD;  Location: [place];  Service: Orthopedics;  Laterality: Left;  Left Long, Ring, and Small finger A-1 Pulley Release    Current Medications: No outpatient medications have been marked as taking for the 06/26/19 encounter (Appointment) with O'Neal, Cassie Freer, MD.     Allergies:    Codeine   Social History: Social History   Socioeconomic History  . Marital status: Single    Spouse name: Not on file  . Number of children: Not on file  . Years of education: Not on file  . Highest education level: Not on file  Occupational History  . Not on file  Social Needs  . Financial resource strain: Not on file  . Food insecurity    Worry: Not  on file    Inability: Not on file  . Transportation needs    Medical: Not on file    Non-medical: Not on file  Tobacco Use  . Smoking status: Never Smoker  Substance and Sexual Activity  . Alcohol use: Yes    Comment: rare  . Drug use: No  . Sexual activity: Not on file  Lifestyle  . Physical activity    Days per week: Not on file    Minutes per session: Not on file  . Stress: Not on file  Relationships  . Social Herbalist on phone: Not on file    Gets together: Not on file    Attends religious service: Not on file    Active member of club or organization: Not on file    Attends meetings of clubs or organizations: Not on file    Relationship status: Not on file  Other Topics Concern  . Not on file  Social History Narrative  . Not on file     Family History: The patient's ***family history is not on file.  ROS:   All other ROS reviewed and negative. Pertinent positives noted in the HPI.     EKGs/Labs/Other Studies Reviewed:   The following studies were personally reviewed by me today:  EKG:  EKG is ***  ordered today.  The ekg ordered today demonstrates ***, and was personally reviewed by me.   Recent Labs: No results found for requested labs within last 8760 hours.   Recent Lipid Panel    Component Value Date/Time   CHOL  07/26/2010 0831    163        ATP III CLASSIFICATION:  <200     mg/dL   Desirable  200-239  mg/dL   Borderline High  >=240    mg/dL   High          TRIG 81 07/26/2010 0831   HDL 62 07/26/2010 0831   CHOLHDL 2.6 07/26/2010 0831   VLDL 16 07/26/2010 0831   LDLCALC  07/26/2010 0831    85        Total Cholesterol/HDL:CHD Risk Coronary Heart Disease Risk Table                     Men   Women  1/2 Average Risk   3.4   3.3  Average Risk       5.0   4.4  2 X Average Risk   9.6   7.1  3 X Average Risk  23.4   11.0        Use the calculated Patient Ratio above and the CHD Risk Table to determine the patient's CHD Risk.         ATP III CLASSIFICATION (LDL):  <100     mg/dL   Optimal  100-129  mg/dL   Near or Above                    Optimal  130-159  mg/dL   Borderline  160-189  mg/dL   High  >190     mg/dL   Very High    Physical Exam:   VS:  There were no vitals taken for this visit.   Wt Readings from Last 3 Encounters:  03/02/15 156 lb 5 oz (70.9 kg)  03/01/15 160 lb (72.6 kg)  05/25/12 165 lb (74.8 kg)    General: Well nourished, well developed, in no acute distress Heart: Atraumatic, normal size  Eyes: PEERLA, EOMI  Neck: Supple, no JVD Endocrine: No thryomegaly Cardiac: Normal S1, S2; RRR; no murmurs, rubs, or gallops Lungs: Clear to auscultation bilaterally, no wheezing, rhonchi or rales  Abd: Soft, nontender, no hepatomegaly  Ext: No edema, pulses 2+ Musculoskeletal: No deformities, BUE and BLE strength normal and equal Skin: Warm and dry, no rashes   Neuro: Alert and oriented to person, place, time, and situation, CNII-XII grossly intact, no focal deficits  Psych: Normal mood and affect   ASSESSMENT:   [name] [name] is a 78 y.o. female who presents for the following: No diagnosis found.  PLAN:   There are no diagnoses linked to this encounter.  Disposition: No follow-ups on file.  Medication Adjustments/Labs and Tests Ordered: Current medicines are reviewed at length with the patient today.  Concerns regarding medicines are outlined above.  No orders of the defined types were placed in this encounter.  No orders of the defined types were placed in this encounter.   There are no Patient Instructions on file for this visit.   Signed, Addison Naegeli. Audie Box, [place]  [address] [place], [place] [address] [phone]  06/25/2019 12:30 PM

## 2019-06-26 ENCOUNTER — Ambulatory Visit: Payer: Medicare Other | Admitting: Cardiovascular Disease

## 2019-06-28 ENCOUNTER — Emergency Department (HOSPITAL_COMMUNITY)
Admission: EM | Admit: 2019-06-28 | Discharge: 2019-06-28 | Disposition: A | Discharge status: Home / Self Care | Payer: Medicare Other | Attending: Emergency Medicine | Admitting: Emergency Medicine

## 2019-06-28 ENCOUNTER — Encounter (HOSPITAL_COMMUNITY): Payer: Self-pay | Admitting: Emergency Medicine

## 2019-06-28 ENCOUNTER — Emergency Department (HOSPITAL_COMMUNITY): Payer: Medicare Other

## 2019-06-28 ENCOUNTER — Other Ambulatory Visit: Payer: Self-pay

## 2019-06-28 DIAGNOSIS — Z8673 Personal history of transient ischemic attack (TIA), and cerebral infarction without residual deficits: Secondary | ICD-10-CM | POA: Diagnosis not present

## 2019-06-28 DIAGNOSIS — R079 Chest pain, unspecified: Secondary | ICD-10-CM | POA: Diagnosis not present

## 2019-06-28 DIAGNOSIS — Z79899 Other long term (current) drug therapy: Secondary | ICD-10-CM | POA: Insufficient documentation

## 2019-06-28 DIAGNOSIS — M549 Dorsalgia, unspecified: Secondary | ICD-10-CM | POA: Diagnosis present

## 2019-06-28 DIAGNOSIS — R42 Dizziness and giddiness: Secondary | ICD-10-CM | POA: Diagnosis not present

## 2019-06-28 DIAGNOSIS — Z7982 Long term (current) use of aspirin: Secondary | ICD-10-CM | POA: Insufficient documentation

## 2019-06-28 DIAGNOSIS — I10 Essential (primary) hypertension: Secondary | ICD-10-CM | POA: Insufficient documentation

## 2019-06-28 LAB — COMPREHENSIVE METABOLIC PANEL
ALT: 19 U/L (ref 0–44)
AST: 18 U/L (ref 15–41)
Albumin: 3.9 g/dL (ref 3.5–5.0)
Alkaline Phosphatase: 43 U/L (ref 38–126)
Anion gap: 9 (ref 5–15)
BUN: 19 mg/dL (ref 8–23)
CO2: 26 mmol/L (ref 22–32)
Calcium: 9.6 mg/dL (ref 8.9–10.3)
Chloride: 104 mmol/L (ref 98–111)
Creatinine, Ser: 1.03 mg/dL — ABNORMAL HIGH (ref 0.44–1.00)
GFR calc Af Amer: >60 mL/min (ref >60)
GFR calc non Af Amer: 53 mL/min — ABNORMAL LOW (ref >60)
Glucose, Bld: 138 mg/dL — ABNORMAL HIGH (ref 70–99)
Potassium: 3.9 mmol/L (ref 3.5–5.1)
Sodium: 139 mmol/L (ref 135–145)
Total Bilirubin: 0.6 mg/dL (ref 0.3–1.2)
Total Protein: 6.4 g/dL — ABNORMAL LOW (ref 6.5–8.1)

## 2019-06-28 LAB — URINALYSIS, ROUTINE W REFLEX MICROSCOPIC
Bilirubin Urine: NEGATIVE
Glucose, UA: NEGATIVE mg/dL
Hgb urine dipstick: NEGATIVE
Ketones, ur: NEGATIVE mg/dL
Leukocytes,Ua: NEGATIVE
Nitrite: NEGATIVE
Protein, ur: NEGATIVE mg/dL
Specific Gravity, Urine: 1.016 (ref 1.005–1.030)
pH: 7 (ref 5.0–8.0)

## 2019-06-28 LAB — CBC WITH DIFFERENTIAL/PLATELET
Abs Immature Granulocytes: 0.01 10*3/uL (ref 0.00–0.07)
Basophils Absolute: 0.1 10*3/uL (ref 0.0–0.1)
Basophils Relative: 1 %
Eosinophils Absolute: 0.1 10*3/uL (ref 0.0–0.5)
Eosinophils Relative: 1 %
HCT: 37.5 % (ref 36.0–46.0)
Hemoglobin: 12.4 g/dL (ref 12.0–15.0)
Immature Granulocytes: 0 %
Lymphocytes Relative: 29 %
Lymphs Abs: 1.6 10*3/uL (ref 0.7–4.0)
MCH: 31.6 pg (ref 26.0–34.0)
MCHC: 33.1 g/dL (ref 30.0–36.0)
MCV: 95.7 fL (ref 80.0–100.0)
Monocytes Absolute: 0.4 10*3/uL (ref 0.1–1.0)
Monocytes Relative: 7 %
Neutro Abs: 3.4 10*3/uL (ref 1.7–7.7)
Neutrophils Relative %: 62 %
Platelets: 256 10*3/uL (ref 150–400)
RBC: 3.92 MIL/uL (ref 3.87–5.11)
RDW: 12.7 % (ref 11.5–15.5)
WBC: 5.5 10*3/uL (ref 4.0–10.5)
nRBC: 0 % (ref 0.0–0.2)

## 2019-06-28 LAB — TROPONIN I (HIGH SENSITIVITY): Troponin I (High Sensitivity): 4 ng/L (ref <18)

## 2019-06-28 MED ORDER — IOHEXOL 350 MG/ML SOLN
100.0000 mL | Freq: Once | INTRAVENOUS | Status: AC | PRN
  Administered 2019-06-28: 100 mL via INTRAVENOUS

## 2019-06-28 NOTE — ED Notes (Signed)
Pt ambulated to restroom, denies any dizziness or lightheadedness.

## 2019-06-28 NOTE — ED Notes (Signed)
Patient transported to CT 

## 2019-06-28 NOTE — ED Notes (Signed)
Patient verbalizes understanding of discharge instructions. Opportunity for questioning and answers were provided. Armband removed by staff, pt discharged from ED with daughter.

## 2019-06-28 NOTE — ED Triage Notes (Signed)
Patient reports sudden onset upper/mid back pain onset this morning , denies injury or fall , no fever or chills . Pain increases with movement /changing positions .

## 2019-06-28 NOTE — Discharge Instructions (Addendum)
You were seen in the emergency department for acute upper back pain.  You had blood work urinalysis EKG and a CAT scan of your chest and abdomen that did not show any obvious explanation for your symptoms.  Your CAT scan did show a gallstone and also some enlargement of your left ovary that will need follow-up with your primary care doctor.  Please return to the emergency department if any worsening or concerning symptoms.  Below is the report of your CAT scan.  IMPRESSION:  1. No evidence for aortic dissection or aneurysm.  2. Aortic atherosclerosis.  3. Hiatal hernia.  4. Asymmetric enlargement of the left ovary. Consider further  evaluation with pelvic sonogram.  5. Gallstone.

## 2019-06-28 NOTE — ED Provider Notes (Signed)
[place] EMERGENCY DEPARTMENT Provider Note   CSN: [id number] Arrival date & time: 06/28/19  0406     History Chief Complaint  Patient presents with  . Back Pain    [name] [name] is a 78 y.o. female.  She has a history of hypertension and stroke.  She is complaining of acute onset of intrascapular pain severe in nature started around 7 hours ago and lasted for 3 to 4 hours before abating.  She felt nauseous with it and possibly short of breath.  No numbness or weakness.  Never had this before.  Pain is now resolved.  No recent trauma.  No cough or fever.  No abdominal pain.  The history is provided by the patient.  Back Pain Location:  Thoracic spine Quality:  Aching Pain severity:  Severe Pain is:  Same all the time Onset quality:  Sudden Duration:  3 hours Timing:  Constant Progression:  Resolved Chronicity:  New Context: not falling, not recent illness and not recent injury   Relieved by:  None tried Worsened by:  Nothing Ineffective treatments:  None tried Associated symptoms: chest pain   Associated symptoms: no abdominal pain, no bladder incontinence, no bowel incontinence, no dysuria, no fever, no leg pain, no numbness and no weakness        Past Medical History:  Diagnosis Date  . Anxiety   . Arthritis   . Depression   . Hyperlipemia   . Hypertension   . Stroke [facility]) 1/12   rt sides weakness-lasted 36hr    Patient Active Problem List   Diagnosis Date Noted  . UNSPECIFIED TRANSIENT CEREBRAL ISCHEMIA 07/28/2010    Past Surgical History:  Procedure Laterality Date  . ABDOMINAL HYSTERECTOMY    . APPENDECTOMY    . COLONOSCOPY    . DIAGNOSTIC LAPAROSCOPY     ovarian cyst  . DILATION AND CURETTAGE OF UTERUS    . ORIF WRIST FRACTURE Right 03/02/2015   Procedure: OPEN REDUCTION INTERNAL FIXATION (ORIF) WRIST FRACTURE;  Surgeon: Iran Planas, MD;  Location: [place];  Service: Orthopedics;  Laterality: Right;  . TONSILLECTOMY    .  TRIGGER FINGER RELEASE  05/30/2012   Procedure: RELEASE TRIGGER FINGER/A-1 PULLEY;  Surgeon: Cammie Sickle., MD;  Location: [place];  Service: Orthopedics;  Laterality: Left;  Left Long, Ring, and Small finger A-1 Pulley Release     OB History   No obstetric history on file.     No family history on file.  Social History   Tobacco Use  . Smoking status: Never Smoker  . Smokeless tobacco: Never Used  Substance Use Topics  . Alcohol use: Yes    Comment: rare  . Drug use: No    Home Medications Prior to Admission medications   Medication Sig Start Date End Date Taking? Authorizing Provider  aspirin 325 MG tablet Take 325 mg by mouth daily.    [provider]  atorvastatin (LIPITOR) 40 MG tablet Take 40 mg by mouth daily.    [provider]  docusate sodium (COLACE) 100 MG capsule Take 1 capsule (100 mg total) by mouth 2 (two) times daily. 03/02/15   Iran Planas, MD  docusate sodium (COLACE) 100 MG capsule Take 1 capsule (100 mg total) by mouth 2 (two) times daily. 03/02/15   Iran Planas, MD  FLUoxetine (PROZAC) 40 MG capsule Take 40 mg by mouth daily.    [provider]  hydrochlorothiazide (MICROZIDE) 12.5 MG capsule Take  12.5 mg by mouth daily.    [provider]  HYDROcodone-acetaminophen (NORCO/VICODIN) 5-325 MG per tablet Take 1-2 tablets by mouth every 6 (six) hours as needed for moderate pain or severe pain. 03/01/15   Muthersbaugh, Jarrett Soho, PA-C  lisinopril (PRINIVIL,ZESTRIL) 10 MG tablet Take 10 mg by mouth daily.    [provider]  losartan (COZAAR) 50 MG tablet Take 50 mg by mouth daily.    [provider]  methocarbamol (ROBAXIN) 500 MG tablet Take 1 tablet (500 mg total) by mouth 4 (four) times daily. 03/02/15   Iran Planas, MD  methocarbamol (ROBAXIN) 500 MG tablet Take 1 tablet (500 mg total) by mouth 4 (four) times daily. 03/02/15   Iran Planas, MD  ondansetron (ZOFRAN) 4 MG tablet Take 1  tablet (4 mg total) by mouth every 8 (eight) hours as needed for nausea or vomiting. 03/02/15   Iran Planas, MD  ondansetron (ZOFRAN) 4 MG tablet Take 1 tablet (4 mg total) by mouth every 8 (eight) hours as needed for nausea or vomiting. 03/02/15   Iran Planas, MD  oxyCODONE-acetaminophen (ROXICET) 5-325 MG per tablet Take 1 tablet by mouth every 4 (four) hours as needed for severe pain. 03/02/15   Iran Planas, MD  simvastatin (ZOCOR) 40 MG tablet Take 40 mg by mouth every morning.    [provider]  vitamin C (ASCORBIC ACID) 500 MG tablet Take 1 tablet (500 mg total) by mouth daily. 03/02/15   Iran Planas, MD    Allergies    Codeine  Review of Systems   Review of Systems  Constitutional: Negative for fever.  HENT: Negative for sore throat.   Eyes: Negative for visual disturbance.  Respiratory: Negative for shortness of breath.   Cardiovascular: Positive for chest pain.  Gastrointestinal: Positive for nausea. Negative for abdominal pain and bowel incontinence.  Genitourinary: Negative for bladder incontinence and dysuria.  Musculoskeletal: Positive for back pain.  Skin: Negative for rash.  Neurological: Positive for light-headedness. Negative for weakness and numbness.    Physical Exam Updated Vital Signs BP 134/69 (BP Location: Right Arm)   Pulse (!) 59   Temp 98.1 F (36.7 C) (Oral)   Resp 16   SpO2 95%   Physical Exam Vitals and nursing note reviewed.  Constitutional:      General: She is not in acute distress.    Appearance: She is well-developed.  HENT:     Head: Normocephalic and atraumatic.  Eyes:     Conjunctiva/sclera: Conjunctivae normal.  Cardiovascular:     Rate and Rhythm: Normal rate and regular rhythm.     Pulses: Normal pulses.     Heart sounds: Murmur present.  Pulmonary:     Effort: Pulmonary effort is normal. No respiratory distress.     Breath sounds: Normal breath sounds.  Abdominal:     Palpations: Abdomen is soft.     Tenderness:  There is no abdominal tenderness.  Musculoskeletal:        General: No deformity or signs of injury. Normal range of motion.     Cervical back: Neck supple.  Skin:    General: Skin is warm and dry.     Capillary Refill: Capillary refill takes less than 2 seconds.  Neurological:     General: No focal deficit present.     Mental Status: She is alert.     Sensory: No sensory deficit.     Motor: No weakness.     Gait: Gait normal.  ED Results / Procedures / Treatments   Labs (all labs ordered are listed, but only abnormal results are displayed) Labs Reviewed  COMPREHENSIVE METABOLIC PANEL - Abnormal; Notable for the following components:      Result Value   Glucose, Bld 138 (*)    Creatinine, Ser 1.03 (*)    Total Protein 6.4 (*)    GFR calc non Af Amer 53 (*)    All other components within normal limits  CBC WITH DIFFERENTIAL/PLATELET  URINALYSIS, ROUTINE W REFLEX MICROSCOPIC  TROPONIN I (HIGH SENSITIVITY)    EKG EKG Interpretation  Date/Time:  Thursday June 28 2019 04:14:29 EST Ventricular Rate:  62 PR Interval:  152 QRS Duration: 76 QT Interval:  396 QTC Calculation: 401 R Axis:   -29 Text Interpretation: Normal sinus rhythm Minimal voltage criteria for LVH, may be normal variant ( R in aVL ) Nonspecific T wave abnormality Abnormal ECG similar to prior Confirmed by Aletta Edouard [phone]) on 06/28/2019 10:41:43 AM   Radiology CT Angio Chest/Abd/Pel for Dissection W and/or W/WO  Result Date: 06/28/2019 CLINICAL DATA:  Chest pain EXAM: CT ANGIOGRAPHY CHEST, ABDOMEN AND PELVIS TECHNIQUE: Multidetector CT imaging through the chest, abdomen and pelvis was performed using the standard protocol during bolus administration of intravenous contrast. Multiplanar reconstructed images and MIPs were obtained and reviewed to evaluate the vascular anatomy. CONTRAST:  152mL OMNIPAQUE IOHEXOL 350 MG/ML SOLN COMPARISON:  None. FINDINGS: CTA CHEST FINDINGS Cardiovascular:  Preferential opacification of the thoracic aorta. No evidence of thoracic aortic aneurysm or dissection. Mild cardiac enlargement. Aortic atherosclerosis. Mediastinum/Nodes: No enlarged mediastinal, hilar, or axillary lymph nodes. Thyroid gland, trachea, and esophagus demonstrate no significant findings. Lungs/Pleura: No pleural effusion identified. No airspace consolidation, atelectasis or pneumothorax. No suspicious pulmonary nodule or mass. Musculoskeletal: Spondylosis within the thoracic spine. Review of the MIP images confirms the above findings. CTA ABDOMEN AND PELVIS FINDINGS VASCULAR Aorta: Normal caliber aorta without aneurysm, dissection, vasculitis or significant stenosis. Aortic atherosclerosis noted. Celiac: Patent without evidence of aneurysm, dissection, vasculitis or significant stenosis. SMA: Patent without evidence of aneurysm, dissection, vasculitis or significant stenosis. Renals: Both renal arteries are patent without evidence of aneurysm, dissection, vasculitis, fibromuscular dysplasia or significant stenosis. IMA: Patent without evidence of aneurysm, dissection, vasculitis or significant stenosis. Inflow: Patent without evidence of aneurysm, dissection, vasculitis or significant stenosis. Veins: No obvious venous abnormality within the limitations of this arterial phase study. Review of the MIP images confirms the above findings. NON-VASCULAR Hepatobiliary: No focal liver abnormality identified. Gallstone measures 7 mm. No gallbladder wall thickening or inflammation. No biliary ductal dilatation. Pancreas: Unremarkable. No pancreatic ductal dilatation or surrounding inflammatory changes. Spleen: Normal in size without focal abnormality. Adrenals/Urinary Tract: Normal appearance of the adrenal glands. Bilateral kidney cysts are noted. Unremarkable appearance of the urinary bladder. Stomach/Bowel: Moderate size hiatal hernia. Stomach otherwise unremarkable. No evidence of bowel wall thickening,  distention, or inflammatory changes. Lymphatic: No significant vascular findings are present. No enlarged abdominal or pelvic lymph nodes. Reproductive: Status post hysterectomy. Asymmetric enlargement of the left ovary is identified. Other: No abdominal wall hernia or abnormality. No abdominopelvic ascites. Musculoskeletal: No acute or significant osseous findings. Review of the MIP images confirms the above findings. IMPRESSION: 1. No evidence for aortic dissection or aneurysm. 2. Aortic atherosclerosis. 3. Hiatal hernia. 4. Asymmetric enlargement of the left ovary. Consider further evaluation with pelvic sonogram. 5. Gallstone. Aortic Atherosclerosis (ICD10-I70.0). Electronically Signed   By: Kerby Moors M.D.   On: 06/28/2019 10:26    Procedures  Procedures (including critical care time)  Medications Ordered in ED Medications - No data to display  ED Course  I have reviewed the triage vital signs and the nursing notes.  Pertinent labs & imaging results that were available during my care of the patient were reviewed by me and considered in my medical decision making (see chart for details).  Clinical Course as of Jun 27 1902  Thu Jun 27, 8489  1038 78 year old female with severe upper back pain radiating into her chest and lower back atraumatic.  Differential includes musculoskeletal, reflux, dissection, aneurysm, atypical ACS.  Lab work fairly unremarkable.  I am ordering a troponin and an angio chest abdomen and pelvis.   [MB]  0720 EKG shows normal sinus rhythm probable LVH, nonspecific T waves similar to prior EKG 8/16   [MB]  1051 Her work-up is essentially been unremarkable.  I reviewed the results with her along with the incidental findings on CAT scan.  She understands to follow-up with her primary care doctor   [MB]    Clinical Course User Index [MB] Hayden Rasmussen, MD   MDM Rules/Calculators/A&P     CHA2DS2/VAS Stroke Risk Points      N/A >= 2 Points: High Risk  1 - 1.99  Points: Medium Risk  0 Points: Low Risk    A final score could not be computed because of missing components.: Last  Change: N/A     This score determines the patient's risk of having a stroke if the  patient has atrial fibrillation.      This score is not applicable to this patient. Components are not  calculated.                   Final Clinical Impression(s) / ED Diagnoses Final diagnoses:  Acute upper back pain    Rx / DC Orders ED Discharge Orders    None       Hayden Rasmussen, MD 06/28/19 1904

## 2019-07-03 ENCOUNTER — Other Ambulatory Visit: Payer: Self-pay | Admitting: Family Medicine

## 2019-07-21 ENCOUNTER — Encounter (HOSPITAL_COMMUNITY): Payer: Self-pay | Admitting: Emergency Medicine

## 2019-07-21 ENCOUNTER — Other Ambulatory Visit: Payer: Self-pay

## 2019-07-21 ENCOUNTER — Emergency Department (HOSPITAL_COMMUNITY): Payer: Medicare Other

## 2019-07-21 ENCOUNTER — Emergency Department (HOSPITAL_COMMUNITY)
Admission: EM | Admit: 2019-07-21 | Discharge: 2019-07-21 | Disposition: A | Discharge status: Home / Self Care | Payer: Medicare Other | Attending: Emergency Medicine | Admitting: Emergency Medicine

## 2019-07-21 DIAGNOSIS — I10 Essential (primary) hypertension: Secondary | ICD-10-CM | POA: Insufficient documentation

## 2019-07-21 DIAGNOSIS — R1012 Left upper quadrant pain: Secondary | ICD-10-CM | POA: Insufficient documentation

## 2019-07-21 DIAGNOSIS — R112 Nausea with vomiting, unspecified: Secondary | ICD-10-CM | POA: Diagnosis present

## 2019-07-21 DIAGNOSIS — Z20822 Contact with and (suspected) exposure to covid-19: Secondary | ICD-10-CM | POA: Insufficient documentation

## 2019-07-21 DIAGNOSIS — Z79899 Other long term (current) drug therapy: Secondary | ICD-10-CM | POA: Diagnosis not present

## 2019-07-21 DIAGNOSIS — Z8673 Personal history of transient ischemic attack (TIA), and cerebral infarction without residual deficits: Secondary | ICD-10-CM | POA: Diagnosis not present

## 2019-07-21 LAB — POC SARS CORONAVIRUS 2 AG -  ED: SARS Coronavirus 2 Ag: NEGATIVE

## 2019-07-21 LAB — URINALYSIS, ROUTINE W REFLEX MICROSCOPIC
Bacteria, UA: NONE SEEN
Bilirubin Urine: NEGATIVE
Glucose, UA: NEGATIVE mg/dL
Hgb urine dipstick: NEGATIVE
Ketones, ur: NEGATIVE mg/dL
Nitrite: NEGATIVE
Protein, ur: NEGATIVE mg/dL
Specific Gravity, Urine: >1.046 — ABNORMAL HIGH (ref 1.005–1.030)
pH: 7 (ref 5.0–8.0)

## 2019-07-21 LAB — COMPREHENSIVE METABOLIC PANEL
ALT: 28 U/L (ref 0–44)
AST: 21 U/L (ref 15–41)
Albumin: 3.9 g/dL (ref 3.5–5.0)
Alkaline Phosphatase: 41 U/L (ref 38–126)
Anion gap: 6 (ref 5–15)
BUN: 23 mg/dL (ref 8–23)
CO2: 27 mmol/L (ref 22–32)
Calcium: 9.4 mg/dL (ref 8.9–10.3)
Chloride: 104 mmol/L (ref 98–111)
Creatinine, Ser: 0.82 mg/dL (ref 0.44–1.00)
GFR calc Af Amer: >60 mL/min (ref >60)
GFR calc non Af Amer: >60 mL/min (ref >60)
Glucose, Bld: 165 mg/dL — ABNORMAL HIGH (ref 70–99)
Potassium: 3.7 mmol/L (ref 3.5–5.1)
Sodium: 137 mmol/L (ref 135–145)
Total Bilirubin: 0.4 mg/dL (ref 0.3–1.2)
Total Protein: 6.7 g/dL (ref 6.5–8.1)

## 2019-07-21 LAB — CBC
HCT: 37.8 % (ref 36.0–46.0)
Hemoglobin: 12.4 g/dL (ref 12.0–15.0)
MCH: 31.3 pg (ref 26.0–34.0)
MCHC: 32.8 g/dL (ref 30.0–36.0)
MCV: 95.5 fL (ref 80.0–100.0)
Platelets: 257 10*3/uL (ref 150–400)
RBC: 3.96 MIL/uL (ref 3.87–5.11)
RDW: 12.2 % (ref 11.5–15.5)
WBC: 10 10*3/uL (ref 4.0–10.5)
nRBC: 0 % (ref 0.0–0.2)

## 2019-07-21 LAB — LIPASE, BLOOD: Lipase: 36 U/L (ref 11–51)

## 2019-07-21 MED ORDER — ONDANSETRON HCL 4 MG/2ML IJ SOLN
INTRAMUSCULAR | Status: AC
  Filled 2019-07-21: qty 2

## 2019-07-21 MED ORDER — ONDANSETRON HCL 4 MG/2ML IJ SOLN
4.0000 mg | Freq: Once | INTRAMUSCULAR | Status: AC
  Administered 2019-07-21: 12:00:00 4 mg via INTRAVENOUS

## 2019-07-21 MED ORDER — ONDANSETRON 8 MG PO TBDP: 8.0000 mg | ORAL_TABLET | Freq: Three times a day (TID) | ORAL | 0 refills | Status: AC | PRN

## 2019-07-21 MED ORDER — PROMETHAZINE HCL 25 MG/ML IJ SOLN
6.2500 mg | Freq: Once | INTRAMUSCULAR | Status: AC
  Administered 2019-07-21: 10:00:00 6.25 mg via INTRAVENOUS
  Filled 2019-07-21: qty 1

## 2019-07-21 MED ORDER — SODIUM CHLORIDE 0.9% FLUSH
3.0000 mL | Freq: Once | INTRAVENOUS | Status: AC
  Administered 2019-07-21: 3 mL via INTRAVENOUS

## 2019-07-21 MED ORDER — MORPHINE SULFATE (PF) 2 MG/ML IV SOLN
2.0000 mg | Freq: Once | INTRAVENOUS | Status: AC
  Administered 2019-07-21: 12:00:00 2 mg via INTRAVENOUS
  Filled 2019-07-21: qty 1

## 2019-07-21 MED ORDER — IOHEXOL 300 MG/ML  SOLN
100.0000 mL | Freq: Once | INTRAMUSCULAR | Status: AC | PRN
  Administered 2019-07-21: 12:00:00 100 mL via INTRAVENOUS

## 2019-07-21 MED ORDER — METOCLOPRAMIDE HCL 5 MG/ML IJ SOLN
10.0000 mg | Freq: Once | INTRAMUSCULAR | Status: AC
  Administered 2019-07-21: 10 mg via INTRAVENOUS
  Filled 2019-07-21: qty 2

## 2019-07-21 MED ORDER — FENTANYL CITRATE (PF) 100 MCG/2ML IJ SOLN
25.0000 ug | Freq: Once | INTRAMUSCULAR | Status: AC
  Administered 2019-07-21: 10:00:00 25 ug via INTRAVENOUS
  Filled 2019-07-21: qty 2

## 2019-07-21 MED ORDER — SODIUM CHLORIDE 0.9 % IV BOLUS
500.0000 mL | Freq: Once | INTRAVENOUS | Status: AC
  Administered 2019-07-21: 14:00:00 500 mL via INTRAVENOUS

## 2019-07-21 NOTE — ED Notes (Signed)
Patient transported to CT 

## 2019-07-21 NOTE — ED Notes (Signed)
Pt is NSR on monitor 

## 2019-07-21 NOTE — Discharge Instructions (Addendum)
Do not take microzide (hctz) until you feel improved and are taking fluids by mouth without difficulty Drink plenty of fluids CT continues normal- although an ovarian cyst and a gall stone are noted.  They do not appear to be causing your problems today.  Be sure to discuss these with your doctor to see if any follow up is needed.

## 2019-07-21 NOTE — ED Provider Notes (Signed)
[place] EMERGENCY DEPARTMENT Provider Note   CSN: [id number] Arrival date & time: 07/21/19  [id number]     History Chief Complaint  Patient presents with  . Abdominal Pain  . Near Syncope    [name] MCCUEN is a 79 y.o. female.  HPI    79 year old female presents today complaining of upper back pain, left-sided abdominal pain, nausea and vomiting that woke her from sleep 100.  She reports that she has had multiple episodes of vomiting that she describes as greenish-yellow in color without blood.  She states this is similar to episode she had several weeks ago when she was seen here.  She reports that she has a gallstone she thinks is secondary to that.  She had her usual meals last night but has been unable to keep anything down since this started.  He received Zofran prior to my evaluation is not currently vomiting.  She is complaining of left-sided abdominal pain.  She points to the left upper quadrant.  She describes it as sharp and crampy.  She denies cough, fever, or chills.  She denies any urinary tract infection symptoms or diarrhea.  She lives alone.  She reports a family gathering on Christmas with daughter and grandchild but otherwise reports that she has been home except for going to the grocery store.  There is no known covert exposures.  She has previously had a hysterectomy and appendectomy. Past Medical History:  Diagnosis Date  . Anxiety   . Arthritis   . Depression   . Hyperlipemia   . Hypertension   . Stroke [facility]) 1/12   rt sides weakness-lasted 36hr    Patient Active Problem List   Diagnosis Date Noted  . UNSPECIFIED TRANSIENT CEREBRAL ISCHEMIA 07/28/2010    Past Surgical History:  Procedure Laterality Date  . ABDOMINAL HYSTERECTOMY    . APPENDECTOMY    . COLONOSCOPY    . DIAGNOSTIC LAPAROSCOPY     ovarian cyst  . DILATION AND CURETTAGE OF UTERUS    . ORIF WRIST FRACTURE Right 03/02/2015   Procedure: OPEN REDUCTION INTERNAL FIXATION  (ORIF) WRIST FRACTURE;  Surgeon: Iran Planas, MD;  Location: [place];  Service: Orthopedics;  Laterality: Right;  . TONSILLECTOMY    . TRIGGER FINGER RELEASE  05/30/2012   Procedure: RELEASE TRIGGER FINGER/A-1 PULLEY;  Surgeon: Cammie Sickle., MD;  Location: [place];  Service: Orthopedics;  Laterality: Left;  Left Long, Ring, and Small finger A-1 Pulley Release     OB History   No obstetric history on file.     History reviewed. No pertinent family history.  Social History   Tobacco Use  . Smoking status: Never Smoker  . Smokeless tobacco: Never Used  Substance Use Topics  . Alcohol use: Yes    Comment: rare  . Drug use: No    Home Medications Prior to Admission medications   Medication Sig Start Date End Date Taking? Authorizing Provider  esomeprazole (NEXIUM) 40 MG capsule Take 40 mg by mouth daily. 03/02/19   [provider]  hydrochlorothiazide (MICROZIDE) 12.5 MG capsule Take 12.5 mg by mouth daily.    [provider]  ibuprofen (ADVIL) 200 MG tablet Take 400-600 mg by mouth every 6 (six) hours as needed for mild pain.    [provider]  losartan (COZAAR) 50 MG tablet Take 50 mg by mouth daily.    [provider]  traZODone (DESYREL) 50 MG tablet Take 50-100 mg by mouth  at bedtime as needed for sleep. 05/21/19   [provider]  vitamin C (ASCORBIC ACID) 500 MG tablet Take 1 tablet (500 mg total) by mouth daily. 03/02/15   Iran Planas, MD    Allergies    Codeine  Review of Systems   Review of Systems  All other systems reviewed and are negative.   Physical Exam Updated Vital Signs BP (!) 118/51 (BP Location: Right Arm)   Pulse 62   Temp 97.6 F (36.4 C) (Oral)   Resp 16   SpO2 98%   Physical Exam Vitals and nursing note reviewed.  Constitutional:      General: She is in acute distress.     Appearance: She is well-developed. She is not ill-appearing.  HENT:     Head: Normocephalic.      Mouth/Throat:     Mouth: Mucous membranes are moist.  Eyes:     Extraocular Movements: Extraocular movements intact.  Cardiovascular:     Rate and Rhythm: Normal rate and regular rhythm.  Pulmonary:     Effort: Pulmonary effort is normal.     Breath sounds: Normal breath sounds.  Abdominal:     General: Abdomen is flat. Bowel sounds are normal.     Palpations: Abdomen is soft.     Tenderness: There is abdominal tenderness in the left upper quadrant and left lower quadrant. There is no right CVA tenderness, left CVA tenderness, guarding or rebound. Negative signs include Murphy's sign, McBurney's sign and psoas sign.  Skin:    General: Skin is warm and dry.     Capillary Refill: Capillary refill takes less than 2 seconds.  Neurological:     General: No focal deficit present.     Mental Status: She is alert.  Psychiatric:        Mood and Affect: Mood normal.        Behavior: Behavior normal.     ED Results / Procedures / Treatments   Labs (all labs ordered are listed, but only abnormal results are displayed) Labs Reviewed  COMPREHENSIVE METABOLIC PANEL - Abnormal; Notable for the following components:      Result Value   Glucose, Bld 165 (*)    All other components within normal limits  LIPASE, BLOOD  CBC  URINALYSIS, ROUTINE W REFLEX MICROSCOPIC  POC SARS CORONAVIRUS 2 AG -  ED    EKG EKG Interpretation  Date/Time:  Saturday July 21 2019 07:42:32 EST Ventricular Rate:  57 PR Interval:  158 QRS Duration: 78 QT Interval:  428 QTC Calculation: 416 R Axis:   -23 Text Interpretation: Sinus bradycardia Otherwise normal ECG Confirmed by Pattricia Boss [phone]) on 07/21/2019 9:43:55 AM   Radiology No results found.  Procedures Procedures (including critical care time)  Medications Ordered in ED Medications  sodium chloride flush (NS) 0.9 % injection 3 mL (has no administration in time range)  fentaNYL (SUBLIMAZE) injection 25 mcg (has no administration in time  range)    ED Course  I have reviewed the triage vital signs and the nursing notes.  Pertinent labs & imaging results that were available during my care of the patient were reviewed by me and considered in my medical decision making (see chart for details). Reevaluation 1:03 PM Patient complaining of pressure in low back area "like a band."  Continues nauseated.  No vomiting. Patient nauseated but no active vomiting.  She has had phenergan and zofran without relief. Will add reglan and fluid challenge Vitals:   07/21/19 1130  07/21/19 1230  BP: (!) 154/66 (!) 149/67  Pulse: (!) 56 76  Resp: (!) 24 (!) 22  Temp:    SpO2: 97% 91%  3:52 PM  Patient taking some p.o. without vomiting.  Her nausea has been improved since last dose of medications.  I have reviewed her labs  Discussed vital signs, lab results, and Covid testing.  Plan [facility] Covid test.  Patient symptoms are very atypical and no definitive answer.  However this could be caused by Covid.  Plan Zofran outpatient.  Discussed holding her hydrochlorothiazide.  We discussed pushing oral fluids.  Plan for her to discuss with her primary care doctor on Monday to discuss whether or not to restart her hydrochlorothiazide.  We discussed return precautions and need for follow-up and she voiced understanding. MDM Rules/Calculators/A&P                     3:51 PM  Final Clinical Impression(s) / ED Diagnoses Final diagnoses:  Nausea and vomiting, intractability of vomiting not specified, unspecified vomiting type    Rx / DC Orders ED Discharge Orders         Ordered    ondansetron (ZOFRAN ODT) 8 MG disintegrating tablet  Every 8 hours PRN     07/21/19 1553           Pattricia Boss, MD 07/21/19 1556

## 2019-07-21 NOTE — ED Notes (Signed)
Pt. Ate some saltine and drank ginger ale with no problems.

## 2019-07-21 NOTE — ED Notes (Signed)
Granddaughter [name] [name] [phone]

## 2019-07-21 NOTE — ED Triage Notes (Addendum)
Pt to triage via [facility] from home.  Per EMS pt seen in ED approx 1 week ago for gallstones.  C/o L sided abd pain that radiates to back since 1am with nausea and vomiting.  Denies diarrhea.  Lethargic.  States she has been having syncopal episodes.  Zofran 4mg  IM given by EMS.  Pt arrived to triage in wheelchair and states she is unable to sit in wheelchiar.  Pt laid down in floor. No fall.  Informed that she could not be in floor.  Pt assisted to recliner.  States she is unable to rate her pain.  Pt currently in recliner in triage.

## 2019-07-21 NOTE — ED Notes (Signed)
Pt unable to give urine sample at this time 

## 2019-07-23 ENCOUNTER — Emergency Department (HOSPITAL_BASED_OUTPATIENT_CLINIC_OR_DEPARTMENT_OTHER)
Admission: EM | Admit: 2019-07-23 | Discharge: 2019-07-24 | Disposition: A | Discharge status: Home / Self Care | Payer: Medicare Other | Attending: Emergency Medicine | Admitting: Emergency Medicine

## 2019-07-23 ENCOUNTER — Encounter (HOSPITAL_BASED_OUTPATIENT_CLINIC_OR_DEPARTMENT_OTHER): Payer: Self-pay

## 2019-07-23 ENCOUNTER — Other Ambulatory Visit: Payer: Self-pay

## 2019-07-23 DIAGNOSIS — I10 Essential (primary) hypertension: Secondary | ICD-10-CM | POA: Insufficient documentation

## 2019-07-23 DIAGNOSIS — Z79899 Other long term (current) drug therapy: Secondary | ICD-10-CM | POA: Diagnosis not present

## 2019-07-23 DIAGNOSIS — R1011 Right upper quadrant pain: Secondary | ICD-10-CM | POA: Diagnosis present

## 2019-07-23 DIAGNOSIS — U071 COVID-19: Secondary | ICD-10-CM | POA: Insufficient documentation

## 2019-07-23 DIAGNOSIS — R509 Fever, unspecified: Secondary | ICD-10-CM

## 2019-07-23 DIAGNOSIS — K8020 Calculus of gallbladder without cholecystitis without obstruction: Secondary | ICD-10-CM | POA: Diagnosis not present

## 2019-07-23 DIAGNOSIS — Z8673 Personal history of transient ischemic attack (TIA), and cerebral infarction without residual deficits: Secondary | ICD-10-CM | POA: Diagnosis not present

## 2019-07-23 LAB — CBC
HCT: 36.5 % (ref 36.0–46.0)
Hemoglobin: 12.3 g/dL (ref 12.0–15.0)
MCH: 32 pg (ref 26.0–34.0)
MCHC: 33.7 g/dL (ref 30.0–36.0)
MCV: 95.1 fL (ref 80.0–100.0)
Platelets: 209 10*3/uL (ref 150–400)
RBC: 3.84 MIL/uL — ABNORMAL LOW (ref 3.87–5.11)
RDW: 12.8 % (ref 11.5–15.5)
WBC: 15.8 10*3/uL — ABNORMAL HIGH (ref 4.0–10.5)
nRBC: 0 % (ref 0.0–0.2)

## 2019-07-23 LAB — COMPREHENSIVE METABOLIC PANEL
ALT: 21 U/L (ref 0–44)
AST: 18 U/L (ref 15–41)
Albumin: 3.6 g/dL (ref 3.5–5.0)
Alkaline Phosphatase: 57 U/L (ref 38–126)
Anion gap: 12 (ref 5–15)
BUN: 35 mg/dL — ABNORMAL HIGH (ref 8–23)
CO2: 24 mmol/L (ref 22–32)
Calcium: 8.9 mg/dL (ref 8.9–10.3)
Chloride: 98 mmol/L (ref 98–111)
Creatinine, Ser: 0.93 mg/dL (ref 0.44–1.00)
GFR calc Af Amer: >60 mL/min (ref >60)
GFR calc non Af Amer: 60 mL/min — ABNORMAL LOW (ref >60)
Glucose, Bld: 103 mg/dL — ABNORMAL HIGH (ref 70–99)
Potassium: 3.6 mmol/L (ref 3.5–5.1)
Sodium: 134 mmol/L — ABNORMAL LOW (ref 135–145)
Total Bilirubin: 1.7 mg/dL — ABNORMAL HIGH (ref 0.3–1.2)
Total Protein: 6.8 g/dL (ref 6.5–8.1)

## 2019-07-23 LAB — LIPASE, BLOOD: Lipase: 22 U/L (ref 11–51)

## 2019-07-23 LAB — URINALYSIS, MICROSCOPIC (REFLEX)

## 2019-07-23 LAB — URINALYSIS, ROUTINE W REFLEX MICROSCOPIC
Bilirubin Urine: NEGATIVE
Glucose, UA: NEGATIVE mg/dL
Ketones, ur: NEGATIVE mg/dL
Nitrite: NEGATIVE
Protein, ur: NEGATIVE mg/dL
Specific Gravity, Urine: 1.015 (ref 1.005–1.030)
pH: 6 (ref 5.0–8.0)

## 2019-07-23 MED ORDER — SODIUM CHLORIDE 0.9% FLUSH
3.0000 mL | Freq: Once | INTRAVENOUS | Status: DC
  Filled 2019-07-23: qty 3

## 2019-07-23 NOTE — ED Triage Notes (Signed)
Pt c/o cont'd right side abd pain-pt seen at [facility] ED 07/20/2018-states she has cont's to have abd pain and advised to come to ED-[facility]D

## 2019-07-23 NOTE — ED Provider Notes (Signed)
TIME SEEN: 12:07 AM  CHIEF COMPLAINT: Right upper quadrant abdominal pain, fever  HPI: Patient is a 79 year old female with history of hypertension, hyperlipidemia, stroke who presents to the emergency department with right upper quadrant abdominal pain for the past several days with nausea, vomiting and fever.  Last episode of vomiting was January 2.  Had a fever of 101 here in the emergency department.  She has had a hysterectomy and appendectomy.  CT scan performed in the hospital 07/21/2019 showed gallstones and left ovarian cyst.  No gallbladder inflammation noted at that time.  Patient presenting with worsening pain.  No chest pain, shortness of breath, cough, Covid exposures.  ROS: See HPI Constitutional:  fever  Eyes: no drainage  ENT: no runny nose   Cardiovascular:  no chest pain  Resp: no SOB  GI:  vomiting GU: no dysuria Integumentary: no rash  Allergy: no hives  Musculoskeletal: no leg swelling  Neurological: no slurred speech ROS otherwise negative  PAST MEDICAL HISTORY/PAST SURGICAL HISTORY:  Past Medical History:  Diagnosis Date  . Anxiety   . Arthritis   . Depression   . Hyperlipemia   . Hypertension   . Stroke [facility]) 1/12   rt sides weakness-lasted 36hr    MEDICATIONS:  Prior to Admission medications   Medication Sig Start Date End Date Taking? Authorizing Provider  esomeprazole (NEXIUM) 40 MG capsule Take 40 mg by mouth daily. 03/02/19   [provider]  hydrochlorothiazide (MICROZIDE) 12.5 MG capsule Take 12.5 mg by mouth daily.    [provider]  ibuprofen (ADVIL) 200 MG tablet Take 400-600 mg by mouth every 6 (six) hours as needed for mild pain.    [provider]  losartan (COZAAR) 50 MG tablet Take 50 mg by mouth daily.    [provider]  ondansetron (ZOFRAN ODT) 8 MG disintegrating tablet Take 1 tablet (8 mg total) by mouth every 8 (eight) hours as needed for nausea or vomiting. 07/21/19   Pattricia Boss, MD  traZODone  (DESYREL) 50 MG tablet Take 50-100 mg by mouth at bedtime as needed for sleep. 05/21/19   [provider]  vitamin C (ASCORBIC ACID) 500 MG tablet Take 1 tablet (500 mg total) by mouth daily. 03/02/15   Iran Planas, MD    ALLERGIES:  Allergies  Allergen Reactions  . Codeine Nausea And Vomiting    SOCIAL HISTORY:  Social History   Tobacco Use  . Smoking status: Never Smoker  . Smokeless tobacco: Never Used  Substance Use Topics  . Alcohol use: Yes    Comment: rare    FAMILY HISTORY: No family history on file.  EXAM: BP 124/70   Pulse 94   Temp 99.3 F (37.4 C) (Oral)   Resp 19   SpO2 95%  CONSTITUTIONAL: Alert and oriented and responds appropriately to questions. Well-appearing; well-nourished, appears younger than stated age HEAD: Normocephalic EYES: Conjunctivae clear, pupils appear equal, EOM appear intact ENT: normal nose; moist mucous membranes NECK: Supple, normal ROM CARD: RRR; S1 and S2 appreciated; no murmurs, no clicks, no rubs, no gallops RESP: Normal chest excursion without splinting or tachypnea; breath sounds clear and equal bilaterally; no wheezes, no rhonchi, no rales, no hypoxia or respiratory distress, speaking full sentences ABD/GI: Normal bowel sounds; non-distended; soft, tender in the right upper quadrant with positive Murphy sign and guarding BACK:  The back appears normal EXT: Normal ROM in all joints; no deformity noted, no edema; no cyanosis SKIN: Normal color for age and  race; warm; no rash on exposed skin NEURO: Moves all extremities equally PSYCH: The patient's mood and manner are appropriate.   MEDICAL DECISION MAKING: Patient here with what I suspect is cholecystitis.  Had a CT of her abdomen pelvis on 07/21/2019 which showed gallstones without inflammation.  LFTs, lipase normal here but she does have a leukocytosis of 16,000 and a fever of 101.  She is status post hysterectomy and appendectomy.  No known Covid exposures or other  symptoms of Covid.  Will obtain rapid Covid swab.  Will give IV fluids, pain and nausea medicine.  Have recommended patient stay n.p.o. at this time as I am concerned for cholecystitis.  She verbalized understanding.  Plan will be to transfer to [facility] hospital for ultrasound as we do not have ultrasound available at [facility] at this time and possible surgical evaluation.  Will discuss with ED physician at [facility].  ED PROGRESS:    12:15 AM  Spoke to Dr. Rolland Porter at [facility] ED who will accept patient in transfer to [facility] ED.     I reviewed all nursing notes and pertinent previous records as available.  I have interpreted any EKGs, lab and urine results, imaging (as available).   [name] [name] was evaluated in Emergency Department on 07/23/2019 for the symptoms described in the history of present illness. She was evaluated in the context of the global COVID-19 pandemic, which necessitated consideration that the patient might be at risk for infection with the SARS-CoV-2 virus that causes COVID-19. Institutional protocols and algorithms that pertain to the evaluation of patients at risk for COVID-19 are in a state of rapid change based on information released by regulatory bodies including the CDC and federal and state organizations. These policies and algorithms were followed during the patient's care in the ED.  Patient was seen wearing N95, face shield, gloves.    Alyxandria Wentz, Delice Bison, DO 07/24/19 [phone]

## 2019-07-24 ENCOUNTER — Emergency Department (HOSPITAL_COMMUNITY): Payer: Medicare Other

## 2019-07-24 ENCOUNTER — Encounter (HOSPITAL_COMMUNITY): Payer: Self-pay | Admitting: Emergency Medicine

## 2019-07-24 LAB — LACTATE DEHYDROGENASE: LDH: 140 U/L (ref 98–192)

## 2019-07-24 LAB — LACTIC ACID, PLASMA
Lactic Acid, Venous: 1.1 mmol/L (ref 0.5–1.9)
Lactic Acid, Venous: 1.2 mmol/L (ref 0.5–1.9)

## 2019-07-24 LAB — FERRITIN: Ferritin: 428 ng/mL — ABNORMAL HIGH (ref 11–307)

## 2019-07-24 LAB — FIBRINOGEN: Fibrinogen: >800 mg/dL — ABNORMAL HIGH (ref 210–475)

## 2019-07-24 LAB — D-DIMER, QUANTITATIVE: D-Dimer, Quant: 3.85 ug/mL-FEU — ABNORMAL HIGH (ref 0.00–0.50)

## 2019-07-24 LAB — TRIGLYCERIDES: Triglycerides: 112 mg/dL

## 2019-07-24 LAB — C-REACTIVE PROTEIN: CRP: 33.8 mg/dL — ABNORMAL HIGH (ref <1.0)

## 2019-07-24 LAB — SARS CORONAVIRUS 2 BY RT PCR (HOSPITAL ORDER, PERFORMED IN ~~LOC~~ HOSPITAL LAB): SARS Coronavirus 2: POSITIVE — AB

## 2019-07-24 LAB — PROCALCITONIN: Procalcitonin: 5.55 ng/mL

## 2019-07-24 MED ORDER — SODIUM CHLORIDE 0.9 % IV SOLN: INTRAVENOUS | Status: DC

## 2019-07-24 MED ORDER — IOHEXOL 350 MG/ML SOLN
100.0000 mL | Freq: Once | INTRAVENOUS | Status: AC | PRN
  Administered 2019-07-24: 05:00:00 100 mL via INTRAVENOUS

## 2019-07-24 MED ORDER — ONDANSETRON HCL 4 MG/2ML IJ SOLN
4.0000 mg | Freq: Once | INTRAMUSCULAR | Status: AC
  Administered 2019-07-24: 01:00:00 4 mg via INTRAVENOUS
  Filled 2019-07-24: qty 2

## 2019-07-24 MED ORDER — SODIUM CHLORIDE 0.9 % IV BOLUS
1000.0000 mL | Freq: Once | INTRAVENOUS | Status: AC
  Administered 2019-07-24: 05:00:00 1000 mL via INTRAVENOUS

## 2019-07-24 MED ORDER — SODIUM CHLORIDE (PF) 0.9 % IJ SOLN
INTRAMUSCULAR | Status: AC
  Filled 2019-07-24: qty 50

## 2019-07-24 MED ORDER — PIPERACILLIN-TAZOBACTAM 3.375 G IVPB 30 MIN
3.3750 g | Freq: Once | INTRAVENOUS | Status: AC
  Administered 2019-07-24: 01:00:00 3.375 g via INTRAVENOUS
  Filled 2019-07-24 (×2): qty 50

## 2019-07-24 MED ORDER — FENTANYL CITRATE (PF) 100 MCG/2ML IJ SOLN
50.0000 ug | Freq: Once | INTRAMUSCULAR | Status: AC
  Administered 2019-07-24: 50 ug via INTRAVENOUS
  Filled 2019-07-24: qty 2

## 2019-07-24 MED ORDER — AMOXICILLIN-POT CLAVULANATE 875-125 MG PO TABS: 1.0000 | ORAL_TABLET | Freq: Two times a day (BID) | ORAL | 0 refills | Status: DC

## 2019-07-24 MED ORDER — AMOXICILLIN-POT CLAVULANATE 875-125 MG PO TABS
1.0000 | ORAL_TABLET | Freq: Once | ORAL | Status: AC
  Administered 2019-07-24: 08:00:00 1 via ORAL
  Filled 2019-07-24: qty 1

## 2019-07-24 MED ORDER — HYDROCODONE-ACETAMINOPHEN 5-325 MG PO TABS: 1.0000 | ORAL_TABLET | Freq: Four times a day (QID) | ORAL | 0 refills | Status: DC | PRN

## 2019-07-24 MED ORDER — ONDANSETRON HCL 4 MG PO TABS: 4.0000 mg | ORAL_TABLET | Freq: Three times a day (TID) | ORAL | 0 refills | Status: AC | PRN

## 2019-07-24 NOTE — ED Provider Notes (Signed)
Patient transferred from [facility] with complaints of right upper quadrant pain and fever since 3 January.  She had had a CT of her abdomen done on at a another ED visit on January 2 and she was noted to have gallstones without evidence of acute cholecystitis.  She states her fever started after that ED visit.  She states with the first ED visit she was vomiting bile colored fluid that was green and yellow but that has stopped.  She denies diarrhea.  She denies any coughing, sore throat, rhinorrhea.  She states her only exposure to Covid was while she was waiting in the waiting room during her ED visit on the second, she states she was in the waiting room 5 hours.  She denies any other exposure and is surprised her test is positive.  Patient states her pain is underneath her diaphragm on the right side.  Patient is alert and pleasant.  She appears much younger than her stated age.  She is not noted to have any coughing, her lungs are clear.  She is very tender in her right upper quadrant in the area of the gallbladder.  At the time I entered the room patient was getting her ultrasound done.  Additional testing was done due to her Covid being positive, including chest x-ray.\  4:35 AM I talked to the patient about her ultrasound which did not reveal any evidence for cholecystitis.  On review of her laboratory test her D-dimer is very elevated, CTA of the chest was ordered and patient is agreeable.  This could cause her pain if she has a blood clot to the right lower lobe because she states her pain feels like it is underneath her rib cage which would be her diaphragm area.  7:22 AM patient was discussed with Dr. Donato Heinz, general surgery.  He states to have the patient follow-up with them as an outpatient.  I will put her on Augmentin although her ultrasound does not show any acute inflammation of her gallbladder I felt like it would be prudent.  She was also given pain medication and nausea  medication.  7:28 AM I discussed her test results and the plan with the patient and she is happy to be discharged home and follow-up as an outpatient.  Results for orders placed or performed during the hospital encounter of 07/23/19  SARS Coronavirus 2 by RT PCR (hospital order, performed in [facility] hospital lab) Nasopharyngeal Nasopharyngeal Swab   Specimen: Nasopharyngeal Swab  Result Value Ref Range   SARS Coronavirus 2 POSITIVE (A) NEGATIVE  Lipase, blood  Result Value Ref Range   Lipase 22 11 - 51 U/L  Comprehensive metabolic panel  Result Value Ref Range   Sodium 134 (L) 135 - 145 mmol/L   Potassium 3.6 3.5 - 5.1 mmol/L   Chloride 98 98 - 111 mmol/L   CO2 24 22 - 32 mmol/L   Glucose, Bld 103 (H) 70 - 99 mg/dL   BUN 35 (H) 8 - 23 mg/dL   Creatinine, Ser 0.93 0.44 - 1.00 mg/dL   Calcium 8.9 8.9 - 10.3 mg/dL   Total Protein 6.8 6.5 - 8.1 g/dL   Albumin 3.6 3.5 - 5.0 g/dL   AST 18 15 - 41 U/L   ALT 21 0 - 44 U/L   Alkaline Phosphatase 57 38 - 126 U/L   Total Bilirubin 1.7 (H) 0.3 - 1.2 mg/dL   GFR calc non Af Amer 60 (L) >60 mL/min  GFR calc Af Amer >60 >60 mL/min   Anion gap 12 5 - 15  CBC  Result Value Ref Range   WBC 15.8 (H) 4.0 - 10.5 K/uL   RBC 3.84 (L) 3.87 - 5.11 MIL/uL   Hemoglobin 12.3 12.0 - 15.0 g/dL   HCT 36.5 36.0 - 46.0 %   MCV 95.1 80.0 - 100.0 fL   MCH 32.0 26.0 - 34.0 pg   MCHC 33.7 30.0 - 36.0 g/dL   RDW 12.8 11.5 - 15.5 %   Platelets 209 150 - 400 K/uL   nRBC 0.0 0.0 - 0.2 %  Urinalysis, Routine w reflex microscopic  Result Value Ref Range   Color, Urine YELLOW YELLOW   APPearance CLOUDY (A) CLEAR   Specific Gravity, Urine 1.015 1.005 - 1.030   pH 6.0 5.0 - 8.0   Glucose, UA NEGATIVE NEGATIVE mg/dL   Hgb urine dipstick MODERATE (A) NEGATIVE   Bilirubin Urine NEGATIVE NEGATIVE   Ketones, ur NEGATIVE NEGATIVE mg/dL   Protein, ur NEGATIVE NEGATIVE mg/dL   Nitrite NEGATIVE NEGATIVE   Leukocytes,Ua TRACE (A) NEGATIVE  Urinalysis,  Microscopic (reflex)  Result Value Ref Range   RBC / HPF 11-20 0 - 5 RBC/hpf   WBC, UA 6-10 0 - 5 WBC/hpf   Bacteria, UA FEW (A) NONE SEEN   Squamous Epithelial / LPF 6-10 0 - 5   Mucus PRESENT    Hyaline Casts, UA PRESENT    Granular Casts, UA PRESENT    RBC Casts, UA PRESENT   Lactic acid, plasma  Result Value Ref Range   Lactic Acid, Venous 1.1 0.5 - 1.9 mmol/L  Lactic acid, plasma  Result Value Ref Range   Lactic Acid, Venous 1.2 0.5 - 1.9 mmol/L  D-dimer, quantitative  Result Value Ref Range   D-Dimer, Quant 3.85 (H) 0.00 - 0.50 ug/mL-FEU  Procalcitonin  Result Value Ref Range   Procalcitonin 5.55 ng/mL  Lactate dehydrogenase  Result Value Ref Range   LDH 140 98 - 192 U/L  Ferritin  Result Value Ref Range   Ferritin 428 (H) 11 - 307 ng/mL  Triglycerides  Result Value Ref Range   Triglycerides 112 <150 mg/dL  Fibrinogen  Result Value Ref Range   Fibrinogen >800 (H) 210 - 475 mg/dL  C-reactive protein  Result Value Ref Range   CRP 33.8 (H) <1.0 mg/dL   Laboratory interpretation all normal except leukocytosis, only elevation of her liver test is her total bilirubin   CT Angio Chest PE W/Cm &/Or Wo Cm  Result Date: 07/24/2019 CLINICAL DATA:  Right upper quadrant pain EXAM: CT ANGIOGRAPHY CHEST WITH CONTRAST TECHNIQUE: Multidetector CT imaging of the chest was performed using the standard protocol during bolus administration of intravenous contrast. Multiplanar CT image reconstructions and MIPs were obtained to evaluate the vascular anatomy. CONTRAST:  179mL OMNIPAQUE IOHEXOL 350 MG/ML SOLN COMPARISON:  06/28/2019 FINDINGS: Cardiovascular: Generous heart size. No pericardial effusion. Coronary atherosclerotic calcification. Satisfactory opacification the pulmonary arteries. No filling defect when allowing for areas of motion artifact. Mediastinum/Nodes: Small sliding hiatal hernia. No adenopathy or mass. Lungs/Pleura: The central airways are clear. Mild dependent  atelectasis. There is no edema, consolidation, effusion, or pneumothorax. Upper Abdomen: Cholelithiasis without signs of acute cholecystitis. Musculoskeletal: No acute or aggressive finding. Review of the MIP images confirms the above findings. IMPRESSION: 1. No evidence of pulmonary embolism. 2. Dependent atelectasis. 3. Coronary atherosclerosis. 4. Cholelithiasis. Electronically Signed   By: Monte Fantasia M.D.   On: 07/24/2019 05:59  CT ABDOMEN PELVIS W CONTRAST  Result Date: 07/21/2019 CLINICAL DATA:  Left upper quadrant abdominal pain  IMPRESSION: 1. No acute findings within the abdomen or pelvis. 2. Aortic atherosclerosis. 3. Gallstone. 4. Left ovarian cysts. This is almost certainly benign, but follow up ultrasound is recommended in 1 year according to the Society of Radiologists in [place] Statement (D Clovis Riley et al. Management of Asymptomatic Ovarian and Other Adnexal Cysts Imaged at [place]: Society of Radiologists in [place] [employer] Statement 2010. Radiology 256 (Sept 2010): 943-954.). Aortic Atherosclerosis (ICD10-I70.0). Electronically Signed   By: Kerby Moors M.D.   On: 07/21/2019 12:32   DG Chest Port 1 View  Result Date: 07/24/2019 CLINICAL DATA:  COVID-19. Fever. EXAM: PORTABLE CHEST 1 VIEW COMPARISON:  None. FINDINGS: The heart size and mediastinal contours are within normal limits. There is bibasilar atelectasis but the lungs are otherwise clear. The visualized skeletal structures are unremarkable. IMPRESSION: No active cardiopulmonary disease. Electronically Signed   By: Ulyses Jarred M.D.   On: 07/24/2019 02:56   CT Angio Chest/Abd/Pel for Dissection W and/or W/WO  Result Date: 06/28/2019 CLINICAL DATA:  Chest pain EXAM: CT ANGIOGRAPHY CHEST, ABDOMEN AND PELVIS TECHNIQUE:  IMPRESSION: 1. No evidence for aortic dissection or aneurysm. 2. Aortic atherosclerosis. 3. Hiatal hernia. 4. Asymmetric enlargement of the left ovary. Consider further  evaluation with pelvic sonogram. 5. Gallstone. Aortic Atherosclerosis (ICD10-I70.0). Electronically Signed   By: Kerby Moors M.D.   On: 06/28/2019 10:26   US Abdomen Limited RUQ  Result Date: 07/24/2019 CLINICAL DATA:  Right upper quadrant pain EXAM: ULTRASOUND ABDOMEN LIMITED RIGHT UPPER QUADRANT COMPARISON:  None. FINDINGS: Gallbladder: No gallstones or wall thickening visualized. No sonographic Murphy sign noted by sonographer. Common bile duct: Diameter: 4 mm Liver: No focal lesion identified. Within normal limits in parenchymal echogenicity. Portal vein is patent on color Doppler imaging with normal direction of blood flow towards the liver. Other: None. IMPRESSION: No cholelithiasis or other evidence of acute cholecystitis. Electronically Signed   By: Ulyses Jarred M.D.   On: 07/24/2019 02:55    EKG Interpretation  Date/Time:  Tuesday July 24 2019 02:06:06 EST Ventricular Rate:  82 PR Interval:    QRS Duration: 83 QT Interval:  353 QTC Calculation: 413 R Axis:   -9 Text Interpretation: Sinus rhythm Borderline short PR interval Nonspecific T abnormalities, anterior leads Baseline wander in lead(s) V3 Since last tracing rate faster 21 Jul 2019 Confirmed by Rolland Porter [phone]) on 07/24/2019 2:52:40 AM      Diagnoses that have been ruled out:  None  Diagnoses that are still under consideration:  None  Final diagnoses:  Gallstones  Fever, unspecified fever cause  RUQ pain  COVID-19    ED Discharge Orders         Ordered    amoxicillin-clavulanate (AUGMENTIN) 875-125 MG tablet  Every 12 hours     07/24/19 0746    HYDROcodone-acetaminophen (NORCO/VICODIN) 5-325 MG tablet  Every 6 hours PRN     07/24/19 0746    ondansetron (ZOFRAN) 4 MG tablet  Every 8 hours PRN     07/24/19 0746         Plan discharge  Rolland Porter, MD, Barbette Or, MD 07/24/19 [phone]

## 2019-07-24 NOTE — Discharge Instructions (Signed)
Try to avoid eating foods that are high in fat.  Take the antibiotic until gone.  Use the pain medication if needed if you have a flareup, use the nausea medicine if needed for nausea or vomiting.  Once your Covid symptoms are gone, you can call [place] [facility] to schedule an appointment to talk to one of the surgeons about having her gallbladder removed.  As far as your Covid goes if you should get shortness of breath or get lightheaded or dizzy you need to be reevaluated.  You should consider start taking zinc 50 mg once a day, vitamin C, vitamin D and a baby aspirin a day.

## 2019-07-24 NOTE — ED Notes (Signed)
Patient transported to CT 

## 2019-07-24 NOTE — ED Notes (Signed)
Called and notified charge nurse at [facility] of pt being transferred for ultrasound

## 2019-07-24 NOTE — ED Notes (Signed)
[facility] arrived to transport pt to [facility].

## 2019-07-24 NOTE — ED Notes (Signed)
Pt arrived via [facility] from [facility] for Gallbladder Ultrasound. Pt tested positive for COVID 07/24/19.   BP 108/52 HR 85 RR 17 SpO2 96% RA Temp 98.5

## 2019-07-29 LAB — CULTURE, BLOOD (ROUTINE X 2)
Culture: NO GROWTH
Culture: NO GROWTH
Special Requests: ADEQUATE
Special Requests: ADEQUATE

## 2019-08-15 ENCOUNTER — Ambulatory Visit: Payer: Self-pay | Admitting: General Surgery

## 2019-08-17 ENCOUNTER — Encounter (HOSPITAL_BASED_OUTPATIENT_CLINIC_OR_DEPARTMENT_OTHER): Payer: Self-pay | Admitting: General Surgery

## 2019-08-17 ENCOUNTER — Other Ambulatory Visit: Payer: Self-pay

## 2019-08-17 NOTE — Progress Notes (Addendum)
ADDENDUM:   Called and left voicemail message for pt to inform her of surgery time change; asked pt to call back and confirm that she received message to arrive at 0700 on 08-21-2019 and be npo after mn w/ exception clear liquids until 0600 then nothing by mouth. Pt has lab appointment today and to pick-up bag with drink and updated handout instructions with new times.   Spoke w/ via phone for pre-op interview--- PT Lab needs dos----   No           Lab results------ getting CBCdiff, CMP on 08-20-2019 @ 1130/ current ekg in chart/ epic; and positive covid result 07-23-2019 in epic/ chart COVID test ------  No retest needed Arrive at ------- 1000 NPO after ------ MN w/ exception clear liquids until 0900 , which time have completed  pre-surgery ensure drink, then nothing by mouth Medications to take morning of surgery ----- if needed take Norco/ Zofran if needed w/ sips of water Diabetic medication ----- n/a Patient Special Instructions ----- pt will pick-up bag with drink and handout instructions at lap appointment (copy of handout w/ chart)  Pre-Op special Istructions ----- n/a Patient verbalized understanding of instructions that were given at this phone interview. Patient denies shortness of breath, chest pain, fever, cough a this phone interview.   Anesthesia :    PCP: Dr Carol Ada  Cardiologist : previously seen by Dr Wynonia Lawman (no records available) followed by pcp since  Chest x-ray : and Ct angio 07-24-2019 epic EKG : 07-24-2019 epic Echo : 07-28-2010 epic Stress test:  Per pt had a stress test done by Dr Wynonia Lawman several years ago, told normal (results not available in epic) Cardiac Cath :  no Sleep Study/ CPAP : NO Fasting Blood Sugar :  / Checks Blood Sugar -- times a day:    N/A Blood Thinner/ Instructions /Last Dose: NO ASA / Instructions/ Last Dose :  NO

## 2019-08-20 ENCOUNTER — Encounter (HOSPITAL_COMMUNITY)
Admission: RE | Admit: 2019-08-20 | Discharge: 2019-08-20 | Disposition: A | Discharge status: Home / Self Care | Payer: Medicare Other | Source: Ambulatory Visit | Attending: General Surgery | Admitting: General Surgery

## 2019-08-20 ENCOUNTER — Other Ambulatory Visit: Payer: Self-pay

## 2019-08-20 DIAGNOSIS — Z01812 Encounter for preprocedural laboratory examination: Secondary | ICD-10-CM | POA: Insufficient documentation

## 2019-08-20 DIAGNOSIS — K8012 Calculus of gallbladder with acute and chronic cholecystitis without obstruction: Secondary | ICD-10-CM | POA: Diagnosis not present

## 2019-08-20 DIAGNOSIS — Z87891 Personal history of nicotine dependence: Secondary | ICD-10-CM | POA: Diagnosis not present

## 2019-08-20 DIAGNOSIS — M1990 Unspecified osteoarthritis, unspecified site: Secondary | ICD-10-CM | POA: Diagnosis not present

## 2019-08-20 DIAGNOSIS — K8020 Calculus of gallbladder without cholecystitis without obstruction: Secondary | ICD-10-CM | POA: Diagnosis present

## 2019-08-20 DIAGNOSIS — Z79899 Other long term (current) drug therapy: Secondary | ICD-10-CM | POA: Diagnosis not present

## 2019-08-20 DIAGNOSIS — E785 Hyperlipidemia, unspecified: Secondary | ICD-10-CM | POA: Diagnosis not present

## 2019-08-20 DIAGNOSIS — Z8673 Personal history of transient ischemic attack (TIA), and cerebral infarction without residual deficits: Secondary | ICD-10-CM | POA: Diagnosis not present

## 2019-08-20 DIAGNOSIS — I10 Essential (primary) hypertension: Secondary | ICD-10-CM | POA: Diagnosis not present

## 2019-08-20 DIAGNOSIS — Z8616 Personal history of COVID-19: Secondary | ICD-10-CM | POA: Diagnosis not present

## 2019-08-20 LAB — COMPREHENSIVE METABOLIC PANEL
ALT: 396 U/L — ABNORMAL HIGH (ref 0–44)
AST: 413 U/L — ABNORMAL HIGH (ref 15–41)
Albumin: 3.8 g/dL (ref 3.5–5.0)
Alkaline Phosphatase: 193 U/L — ABNORMAL HIGH (ref 38–126)
Anion gap: 8 (ref 5–15)
BUN: 20 mg/dL (ref 8–23)
CO2: 26 mmol/L (ref 22–32)
Calcium: 9.4 mg/dL (ref 8.9–10.3)
Chloride: 101 mmol/L (ref 98–111)
Creatinine, Ser: 1.41 mg/dL — ABNORMAL HIGH (ref 0.44–1.00)
GFR calc Af Amer: 42 mL/min — ABNORMAL LOW (ref >60)
GFR calc non Af Amer: 36 mL/min — ABNORMAL LOW (ref >60)
Glucose, Bld: 107 mg/dL — ABNORMAL HIGH (ref 70–99)
Potassium: 4.5 mmol/L (ref 3.5–5.1)
Sodium: 135 mmol/L (ref 135–145)
Total Bilirubin: 2.2 mg/dL — ABNORMAL HIGH (ref 0.3–1.2)
Total Protein: 7 g/dL (ref 6.5–8.1)

## 2019-08-20 LAB — CBC WITH DIFFERENTIAL/PLATELET
Abs Immature Granulocytes: 0.02 10*3/uL (ref 0.00–0.07)
Basophils Absolute: 0 10*3/uL (ref 0.0–0.1)
Basophils Relative: 1 %
Eosinophils Absolute: 0 10*3/uL (ref 0.0–0.5)
Eosinophils Relative: 1 %
HCT: 36.3 % (ref 36.0–46.0)
Hemoglobin: 11.5 g/dL — ABNORMAL LOW (ref 12.0–15.0)
Immature Granulocytes: 0 %
Lymphocytes Relative: 16 %
Lymphs Abs: 0.9 10*3/uL (ref 0.7–4.0)
MCH: 30.3 pg (ref 26.0–34.0)
MCHC: 31.7 g/dL (ref 30.0–36.0)
MCV: 95.8 fL (ref 80.0–100.0)
Monocytes Absolute: 0.5 10*3/uL (ref 0.1–1.0)
Monocytes Relative: 9 %
Neutro Abs: 4 10*3/uL (ref 1.7–7.7)
Neutrophils Relative %: 73 %
Platelets: 222 10*3/uL (ref 150–400)
RBC: 3.79 MIL/uL — ABNORMAL LOW (ref 3.87–5.11)
RDW: 12.8 % (ref 11.5–15.5)
WBC: 5.4 10*3/uL (ref 4.0–10.5)
nRBC: 0 % (ref 0.0–0.2)

## 2019-08-20 MED ORDER — ENSURE PRE-SURGERY PO LIQD
296.0000 mL | Freq: Once | ORAL | Status: DC
  Filled 2019-08-20: qty 296

## 2019-08-20 NOTE — Anesthesia Preprocedure Evaluation (Addendum)
Anesthesia Evaluation  Patient identified by MRN, date of birth, ID band Patient awake    Reviewed: Allergy & Precautions, NPO status , Patient's Chart, lab work & pertinent test results  Airway Mallampati: II  TM Distance: >3 FB Neck ROM: Full    Dental no notable dental hx. (+) Teeth Intact, Dental Advisory Given   Pulmonary former smoker,  Tested pos for Covid 19  07/23/19    Pulmonary exam normal breath sounds clear to auscultation       Cardiovascular hypertension, Pt. on medications Normal cardiovascular exam Rhythm:Regular Rate:Normal  07/25/19 NSR R 82 NSST changes   Neuro/Psych PSYCHIATRIC DISORDERS    GI/Hepatic negative GI ROS, Neg liver ROS,   Endo/Other  negative endocrine ROS  Renal/GU K+ 4.5 Cr 1.41     Musculoskeletal   Abdominal   Peds  Hematology Hgb 11.5 Plt 222   Anesthesia Other Findings   Reproductive/Obstetrics                          Anesthesia Physical Anesthesia Plan  ASA: III  Anesthesia Plan: General   Post-op Pain Management:    Induction: Intravenous  PONV Risk Score and Plan: 3 and Ondansetron, Dexamethasone and Treatment may vary due to age or medical condition  Airway Management Planned: Oral ETT  Additional Equipment: None  Intra-op Plan:   Post-operative Plan: Extubation in OR  Informed Consent: I have reviewed the patients History and Physical, chart, labs and discussed the procedure including the risks, benefits and alternatives for the proposed anesthesia with the patient or authorized representative who has indicated his/her understanding and acceptance.     Dental advisory given  Plan Discussed with: CRNA  Anesthesia Plan Comments:        Anesthesia Quick Evaluation

## 2019-08-21 ENCOUNTER — Encounter (HOSPITAL_BASED_OUTPATIENT_CLINIC_OR_DEPARTMENT_OTHER)
Admission: RE | Disposition: A | Discharge status: Home / Self Care | Payer: Self-pay | Source: Home / Self Care | Attending: General Surgery

## 2019-08-21 ENCOUNTER — Encounter (HOSPITAL_BASED_OUTPATIENT_CLINIC_OR_DEPARTMENT_OTHER): Payer: Self-pay | Admitting: General Surgery

## 2019-08-21 ENCOUNTER — Ambulatory Visit (HOSPITAL_BASED_OUTPATIENT_CLINIC_OR_DEPARTMENT_OTHER): Payer: Medicare Other | Admitting: Anesthesiology

## 2019-08-21 ENCOUNTER — Ambulatory Visit (HOSPITAL_BASED_OUTPATIENT_CLINIC_OR_DEPARTMENT_OTHER)
Admission: RE | Admit: 2019-08-21 | Discharge: 2019-08-21 | Disposition: A | Discharge status: Home / Self Care | Payer: Medicare Other | Attending: General Surgery | Admitting: General Surgery

## 2019-08-21 ENCOUNTER — Other Ambulatory Visit: Payer: Self-pay

## 2019-08-21 DIAGNOSIS — E785 Hyperlipidemia, unspecified: Secondary | ICD-10-CM | POA: Diagnosis not present

## 2019-08-21 DIAGNOSIS — Z8673 Personal history of transient ischemic attack (TIA), and cerebral infarction without residual deficits: Secondary | ICD-10-CM | POA: Insufficient documentation

## 2019-08-21 DIAGNOSIS — M1990 Unspecified osteoarthritis, unspecified site: Secondary | ICD-10-CM | POA: Diagnosis not present

## 2019-08-21 DIAGNOSIS — Z79899 Other long term (current) drug therapy: Secondary | ICD-10-CM | POA: Insufficient documentation

## 2019-08-21 DIAGNOSIS — I10 Essential (primary) hypertension: Secondary | ICD-10-CM | POA: Diagnosis not present

## 2019-08-21 DIAGNOSIS — Z87891 Personal history of nicotine dependence: Secondary | ICD-10-CM | POA: Insufficient documentation

## 2019-08-21 DIAGNOSIS — Z8616 Personal history of COVID-19: Secondary | ICD-10-CM | POA: Insufficient documentation

## 2019-08-21 DIAGNOSIS — K8012 Calculus of gallbladder with acute and chronic cholecystitis without obstruction: Secondary | ICD-10-CM | POA: Diagnosis not present

## 2019-08-21 HISTORY — DX: Personal history of other medical treatment: Z92.89

## 2019-08-21 HISTORY — DX: Cramp and spasm: R25.2

## 2019-08-21 HISTORY — DX: Personal history of transient ischemic attack (TIA), and cerebral infarction without residual deficits: Z86.73

## 2019-08-21 HISTORY — DX: Cholecystitis, unspecified: K81.9

## 2019-08-21 HISTORY — DX: Personal history of COVID-19: Z86.16

## 2019-08-21 HISTORY — PX: CHOLECYSTECTOMY: SHX55

## 2019-08-21 HISTORY — DX: Other constipation: K59.09

## 2019-08-21 HISTORY — DX: Personal history of other (healed) physical injury and trauma: Z87.828

## 2019-08-21 SURGERY — LAPAROSCOPIC CHOLECYSTECTOMY
Anesthesia: General | Site: Abdomen

## 2019-08-21 MED ORDER — SUGAMMADEX SODIUM 200 MG/2ML IV SOLN
INTRAVENOUS | Status: DC | PRN
  Administered 2019-08-21: 200 mg via INTRAVENOUS

## 2019-08-21 MED ORDER — ACETAMINOPHEN 500 MG PO TABS
1000.0000 mg | ORAL_TABLET | ORAL | Status: AC
  Administered 2019-08-21: 1000 mg via ORAL
  Filled 2019-08-21: qty 2

## 2019-08-21 MED ORDER — KETOROLAC TROMETHAMINE 30 MG/ML IJ SOLN
INTRAMUSCULAR | Status: AC
  Filled 2019-08-21: qty 1

## 2019-08-21 MED ORDER — ONDANSETRON HCL 4 MG/2ML IJ SOLN
INTRAMUSCULAR | Status: AC
  Filled 2019-08-21: qty 2

## 2019-08-21 MED ORDER — LIDOCAINE 2% (20 MG/ML) 5 ML SYRINGE
INTRAMUSCULAR | Status: DC | PRN
  Administered 2019-08-21: 80 mg via INTRAVENOUS

## 2019-08-21 MED ORDER — ACETAMINOPHEN 10 MG/ML IV SOLN
1000.0000 mg | Freq: Once | INTRAVENOUS | Status: DC | PRN
  Filled 2019-08-21: qty 100

## 2019-08-21 MED ORDER — KETOROLAC TROMETHAMINE 15 MG/ML IJ SOLN
15.0000 mg | INTRAMUSCULAR | Status: AC
  Administered 2019-08-21: 15 mg via INTRAVENOUS
  Filled 2019-08-21: qty 1

## 2019-08-21 MED ORDER — EPHEDRINE SULFATE-NACL 50-0.9 MG/10ML-% IV SOSY
PREFILLED_SYRINGE | INTRAVENOUS | Status: DC | PRN
  Administered 2019-08-21: 10 mg via INTRAVENOUS

## 2019-08-21 MED ORDER — CHLORHEXIDINE GLUCONATE CLOTH 2 % EX PADS
6.0000 | MEDICATED_PAD | Freq: Once | CUTANEOUS | Status: DC
  Filled 2019-08-21: qty 6

## 2019-08-21 MED ORDER — ONDANSETRON HCL 4 MG/2ML IJ SOLN
INTRAMUSCULAR | Status: DC | PRN
  Administered 2019-08-21: 4 mg via INTRAVENOUS

## 2019-08-21 MED ORDER — DEXAMETHASONE SODIUM PHOSPHATE 10 MG/ML IJ SOLN
INTRAMUSCULAR | Status: DC | PRN
  Administered 2019-08-21: 10 mg via INTRAVENOUS

## 2019-08-21 MED ORDER — ROCURONIUM BROMIDE 10 MG/ML (PF) SYRINGE
PREFILLED_SYRINGE | INTRAVENOUS | Status: AC
  Filled 2019-08-21: qty 10

## 2019-08-21 MED ORDER — IBUPROFEN 800 MG PO TABS: 800.0000 mg | ORAL_TABLET | Freq: Three times a day (TID) | ORAL | 0 refills | Status: AC | PRN

## 2019-08-21 MED ORDER — FENTANYL CITRATE (PF) 100 MCG/2ML IJ SOLN
INTRAMUSCULAR | Status: DC | PRN
  Administered 2019-08-21 (×2): 50 ug via INTRAVENOUS

## 2019-08-21 MED ORDER — ACETAMINOPHEN 500 MG PO TABS
ORAL_TABLET | ORAL | Status: AC
  Filled 2019-08-21: qty 2

## 2019-08-21 MED ORDER — OXYCODONE HCL 5 MG PO TABS: 5.0000 mg | ORAL_TABLET | Freq: Four times a day (QID) | ORAL | 0 refills | Status: AC | PRN

## 2019-08-21 MED ORDER — MIDAZOLAM HCL 2 MG/2ML IJ SOLN
INTRAMUSCULAR | Status: AC
  Filled 2019-08-21: qty 2

## 2019-08-21 MED ORDER — OXYCODONE HCL 5 MG PO TABS
ORAL_TABLET | ORAL | Status: AC
  Filled 2019-08-21: qty 1

## 2019-08-21 MED ORDER — CEFAZOLIN SODIUM-DEXTROSE 2-4 GM/100ML-% IV SOLN
2.0000 g | INTRAVENOUS | Status: AC
  Administered 2019-08-21: 2 g via INTRAVENOUS
  Filled 2019-08-21: qty 100

## 2019-08-21 MED ORDER — ROCURONIUM BROMIDE 10 MG/ML (PF) SYRINGE
PREFILLED_SYRINGE | INTRAVENOUS | Status: DC | PRN
  Administered 2019-08-21: 10 mg via INTRAVENOUS
  Administered 2019-08-21: 50 mg via INTRAVENOUS

## 2019-08-21 MED ORDER — FENTANYL CITRATE (PF) 100 MCG/2ML IJ SOLN
INTRAMUSCULAR | Status: AC
  Filled 2019-08-21: qty 2

## 2019-08-21 MED ORDER — OXYCODONE HCL 5 MG PO TABS
5.0000 mg | ORAL_TABLET | ORAL | Status: AC
  Administered 2019-08-21: 5 mg via ORAL
  Filled 2019-08-21: qty 1

## 2019-08-21 MED ORDER — EPHEDRINE SULFATE-NACL 50-0.9 MG/10ML-% IV SOSY: PREFILLED_SYRINGE | INTRAVENOUS | Status: DC | PRN

## 2019-08-21 MED ORDER — LACTATED RINGERS IV SOLN
INTRAVENOUS | Status: DC
  Filled 2019-08-21: qty 1000

## 2019-08-21 MED ORDER — ONDANSETRON HCL 4 MG/2ML IJ SOLN
4.0000 mg | Freq: Once | INTRAMUSCULAR | Status: AC | PRN
  Administered 2019-08-21: 4 mg via INTRAVENOUS
  Filled 2019-08-21: qty 2

## 2019-08-21 MED ORDER — FENTANYL CITRATE (PF) 100 MCG/2ML IJ SOLN
25.0000 ug | INTRAMUSCULAR | Status: DC | PRN
  Administered 2019-08-21: 50 ug via INTRAVENOUS
  Filled 2019-08-21: qty 1

## 2019-08-21 MED ORDER — LIDOCAINE 2% (20 MG/ML) 5 ML SYRINGE
INTRAMUSCULAR | Status: AC
  Filled 2019-08-21: qty 5

## 2019-08-21 MED ORDER — CEFAZOLIN SODIUM-DEXTROSE 2-4 GM/100ML-% IV SOLN
INTRAVENOUS | Status: AC
  Filled 2019-08-21: qty 100

## 2019-08-21 MED ORDER — PROPOFOL 10 MG/ML IV BOLUS
INTRAVENOUS | Status: AC
  Filled 2019-08-21: qty 20

## 2019-08-21 MED ORDER — FENTANYL CITRATE (PF) 100 MCG/2ML IJ SOLN
25.0000 ug | INTRAMUSCULAR | Status: DC | PRN
  Administered 2019-08-21 (×4): 50 ug via INTRAVENOUS
  Filled 2019-08-21: qty 1

## 2019-08-21 MED ORDER — MIDAZOLAM HCL 5 MG/5ML IJ SOLN
INTRAMUSCULAR | Status: DC | PRN
  Administered 2019-08-21: 1 mg via INTRAVENOUS

## 2019-08-21 MED ORDER — BUPIVACAINE HCL 0.5 % IJ SOLN
INTRAMUSCULAR | Status: DC | PRN
  Administered 2019-08-21: 30 mL

## 2019-08-21 MED ORDER — DEXAMETHASONE SODIUM PHOSPHATE 10 MG/ML IJ SOLN
INTRAMUSCULAR | Status: AC
  Filled 2019-08-21: qty 1

## 2019-08-21 MED ORDER — PROPOFOL 10 MG/ML IV BOLUS
INTRAVENOUS | Status: DC | PRN
  Administered 2019-08-21: 100 mg via INTRAVENOUS

## 2019-08-21 SURGICAL SUPPLY — 50 items
APPLIER CLIP ROT 10 11.4 M/L (STAPLE) ×3
BENZOIN TINCTURE PRP APPL 2/3 (GAUZE/BANDAGES/DRESSINGS) ×3 IMPLANT
BNDG ADH 1X3 SHEER STRL LF (GAUZE/BANDAGES/DRESSINGS) ×12 IMPLANT
CABLE HIGH FREQUENCY MONO STRZ (ELECTRODE) ×3 IMPLANT
CATH CHOLANG 76X19 KUMAR (CATHETERS) IMPLANT
CHLORAPREP W/TINT 26 (MISCELLANEOUS) ×3 IMPLANT
CLIP APPLIE ROT 10 11.4 M/L (STAPLE) ×1 IMPLANT
CLIP VESOLOCK LG 6/CT PURPLE (CLIP) IMPLANT
CLIP VESOLOCK MED LG 6/CT (CLIP) ×3 IMPLANT
CLOSURE WOUND 1/2 X4 (GAUZE/BANDAGES/DRESSINGS) ×1
COVER MAYO STAND STRL (DRAPES) ×3 IMPLANT
COVER WAND RF STERILE (DRAPES) ×3 IMPLANT
DECANTER SPIKE VIAL GLASS SM (MISCELLANEOUS) ×3 IMPLANT
DERMABOND ADVANCED (GAUZE/BANDAGES/DRESSINGS) ×2
DERMABOND ADVANCED .7 DNX12 (GAUZE/BANDAGES/DRESSINGS) ×1 IMPLANT
DRAIN CHANNEL 19F RND (DRAIN) ×3 IMPLANT
DRAPE C-ARM 42X120 X-RAY (DRAPES) IMPLANT
ELECT REM PT RETURN 9FT ADLT (ELECTROSURGICAL) ×3
ELECTRODE REM PT RTRN 9FT ADLT (ELECTROSURGICAL) ×1 IMPLANT
EVACUATOR SILICONE 100CC (DRAIN) ×3 IMPLANT
GAUZE SPONGE 4X4 12PLY STRL LF (GAUZE/BANDAGES/DRESSINGS) ×3 IMPLANT
GLOVE BIOGEL PI IND STRL 7.0 (GLOVE) ×1 IMPLANT
GLOVE BIOGEL PI INDICATOR 7.0 (GLOVE) ×2
GLOVE SURG SS PI 7.0 STRL IVOR (GLOVE) ×3 IMPLANT
GOWN STRL REUS W/TWL LRG LVL3 (GOWN DISPOSABLE) ×3 IMPLANT
GRASPER SUT TROCAR 14GX15 (MISCELLANEOUS) ×3 IMPLANT
HEMOSTAT SNOW SURGICEL 2X4 (HEMOSTASIS) IMPLANT
IRRIG SUCT STRYKERFLOW 2 WTIP (MISCELLANEOUS) ×3
IRRIGATION SUCT STRKRFLW 2 WTP (MISCELLANEOUS) ×1 IMPLANT
IV CATH 14GX2 1/4 (CATHETERS) IMPLANT
NS IRRIG 500ML POUR BTL (IV SOLUTION) ×3 IMPLANT
PACK BASIN DAY SURGERY FS (CUSTOM PROCEDURE TRAY) ×3 IMPLANT
PENCIL SMOKE EVACUATOR (MISCELLANEOUS) IMPLANT
POUCH RETRIEVAL ECOSAC 10 (ENDOMECHANICALS) ×1 IMPLANT
POUCH RETRIEVAL ECOSAC 10MM (ENDOMECHANICALS) ×2
SCISSORS LAP 5X35 DISP (ENDOMECHANICALS) ×3 IMPLANT
SET TUBE SMOKE EVAC HIGH FLOW (TUBING) ×3 IMPLANT
STOPCOCK 4 WAY LG BORE MALE ST (IV SETS) IMPLANT
STRIP CLOSURE SKIN 1/2X4 (GAUZE/BANDAGES/DRESSINGS) ×2 IMPLANT
SUT ETHILON 2 0 PS N (SUTURE) ×3 IMPLANT
SUT MNCRL AB 4-0 PS2 18 (SUTURE) ×3 IMPLANT
SUT VICRYL 0 ENDOLOOP (SUTURE) IMPLANT
SUT VICRYL 0 UR6 27IN ABS (SUTURE) IMPLANT
TAPE CLOTH SURG 4X10 WHT LF (GAUZE/BANDAGES/DRESSINGS) ×3 IMPLANT
TOWEL OR 17X26 10 PK STRL BLUE (TOWEL DISPOSABLE) ×3 IMPLANT
TRAY LAPAROSCOPIC (CUSTOM PROCEDURE TRAY) ×3 IMPLANT
TROCAR BLADELESS OPT 12M 100M (ENDOMECHANICALS) IMPLANT
TROCAR BLADELESS OPT 5 100 (ENDOMECHANICALS) ×9 IMPLANT
TROCAR XCEL NON-BLD 11X100MML (ENDOMECHANICALS) ×3 IMPLANT
WARMER LAPAROSCOPE (MISCELLANEOUS) IMPLANT

## 2019-08-21 NOTE — Op Note (Signed)
PATIENT:  [name] [name]  79 y.o. female  PRE-OPERATIVE DIAGNOSIS:  gallstones  POST-OPERATIVE DIAGNOSIS:  gallstones  PROCEDURE:  Procedure(s): LAPAROSCOPIC CHOLECYSTECTOMY   SURGEON:  Surgeon(s): Advik Weatherspoon, Arta Bruce, MD Jesusita Oka, MD  ASSISTANT: Chauncey Mann, M.D., the level of inflammation made a second surgeon necessary for assistance in dissection and decision making  ANESTHESIA:   local and general  Indications for procedure: [name] [name] is a 79 y.o. female with symptoms of Abdominal pain and Nausea and vomiting consistent with gallbladder disease, Confirmed by Ultrasound.  Description of procedure: The patient was brought into the operative suite, placed supine. Anesthesia was administered with endotracheal tube. Patient was strapped in place and foot board was secured. All pressure points were offloaded by foam padding. The patient was prepped and draped in the usual sterile fashion.  A small incision was made to the right of the umbilicus. A 29mm trocar was inserted into the peritoneal cavity with optical entry. Pneumoperitoneum was applied with high flow low pressure. 2 82mm trocars were placed in the RUQ. A 44mm trocar was placed in the subxiphoid space. Marcaine was infused to the subxiphoid space and lateral upper right abdomen in the transversus abdominis plane. Next the patient was placed in reverse trendelenberg. The gallbladder was adhered to the abdominal wall and omentum. Blunt dissection was used to dissect the gallbladder away from the abdominal wall. Cautery was used to peel the omentum off the gallbladder. THe gallbladder was thick and inflamed and adhered to the portal triad.  The gallbladder was retracted cephalad and lateral. The peritoneum was reflected off the infundibulum working lateral to medial. Blunt dissection was used to the gallbladder away from the portal triad very carefully. The lateral posterior aspect of the gallbladder was bluntly  dissected away from the liver. The gallbladder was entered anteriorly and drained. The medial posterior gallbladder was slowly dissected free to allow visualization of the cystic artery which was cut during dissection and the cystic stump which was partially necrotic. The cystic duct and cystic artery were identified and further dissection revealed a critical view. The cystic duct and cystic artery were doubly clipped and ligated.   The gallbladder was removed off the liver bed with cautery. The Gallbladder was placed in a specimen bag. The gallbladder fossa was irrigated and hemostasis was applied with cautery. The gallbladder was removed via the 73mm trocar. A 19 fr blake drain was placed with tip along the gallbladder fossa and perihepatic area. The fascial defect was closed with interrupted 0 vicryl suture via laparoscopic trans-fascial suture passer. Pneumoperitoneum was removed, all trocar were removed. All incisions were closed with 4-0 monocryl subcuticular stitch. The patient woke from anesthesia and was brought to PACU in stable condition. All counts were correct  Findings: inflamed and scarred in gallbladder  Specimen: gallbladder  Blood loss: 175 ml  Local anesthesia: 30 ml marcaine  Complications: none  PLAN OF CARE: Discharge to home after PACU  PATIENT DISPOSITION:  PACU - hemodynamically stable.  [name] [name], M.D. General, Bariatric, & Minimally Invasive Surgery [facility]

## 2019-08-21 NOTE — Discharge Instructions (Signed)
Laparoscopic Cholecystectomy, Care After This sheet gives you information about how to care for yourself after your procedure. Your doctor may also give you more specific instructions. If you have problems or questions, contact your doctor. Follow these instructions at home: Care for cuts from surgery (incisions)   Follow instructions from your doctor about how to take care of your cuts from surgery. Make sure you: ? Wash your hands with soap and water before you change your bandage (dressing). If you cannot use soap and water, use hand sanitizer. ? Change your bandage as told by your doctor. ? Leave stitches (sutures), skin glue, or skin tape (adhesive) strips in place. They may need to stay in place for 2 weeks or longer. If tape strips get loose and curl up, you may trim the loose edges. Do not remove tape strips completely unless your doctor says it is okay.  Do not take baths, swim, or use a hot tub until your doctor says it is okay. Ask your doctor if you can take showers. You may only be allowed to take sponge baths for bathing.  Check your surgical cut area every day for signs of infection. Check for: ? More redness, swelling, or pain. ? More fluid or blood. ? Warmth. ? Pus or a bad smell. Activity  Do not drive or use heavy machinery while taking prescription pain medicine.  Do not lift anything that is heavier than 10 lb (4.5 kg) until your doctor says it is okay.  Do not play contact sports until your doctor says it is okay.  Do not drive for 24 hours if you were given a medicine to help you relax (sedative).  Rest as needed. Do not return to work or school until your doctor says it is okay. General instructions  Take over-the-counter and prescription medicines only as told by your doctor.  To prevent or treat constipation while you are taking prescription pain medicine, your doctor may recommend that you: ? Drink enough fluid to keep your pee (urine) clear or pale  yellow. ? Take over-the-counter or prescription medicines. ? Eat foods that are high in fiber, such as fresh fruits and vegetables, whole grains, and beans. ? Limit foods that are high in fat and processed sugars, such as fried and sweet foods. Contact a doctor if:  You develop a rash.  You have more redness, swelling, or pain around your surgical cuts.  You have more fluid or blood coming from your surgical cuts.  Your surgical cuts feel warm to the touch.  You have pus or a bad smell coming from your surgical cuts.  You have a fever.  One or more of your surgical cuts breaks open. Get help right away if:  You have trouble breathing.  You have chest pain.  You have pain that is getting worse in your shoulders.  You faint or feel dizzy when you stand.  You have very bad pain in your belly (abdomen).  You are sick to your stomach (nauseous) for more than one day.  You have throwing up (vomiting) that lasts for more than one day.  You have leg pain. This information is not intended to replace advice given to you by your health care provider. Make sure you discuss any questions you have with your health care provider. Document Revised: 06/17/2017 Document Reviewed: 12/22/2015 Elsevier Patient Education  2020 [place] Surgical drains are used to remove extra fluid that normally builds up in  a surgical wound after surgery. A surgical drain helps to heal a surgical wound. Different kinds of surgical drains include:  Active drains. These drains use suction to pull drainage away from the surgical wound. Drainage flows through a tube to a container outside of the body. With these drains, you need to keep the bulb or the drainage container flat (compressed) at all times, except while you empty it. Flattening the bulb or container creates suction.  Passive drains. These drains allow fluid to drain naturally, by gravity. Drainage flows through a tube  to a bandage (dressing) or a container outside of the body. Passive drains do not need to be emptied. A drain is placed during surgery. Right after surgery, drainage is usually bright red and a little thicker than water. The drainage may gradually turn yellow or pink and become thinner. It is likely that your health care provider will remove the drain when the drainage stops or when the amount decreases to 1-2 Tbsp (15-30 mL) during a 24-hour period. Supplies needed:  Tape.  Germ-free cleaning solution (sterile saline).  Cotton swabs.  Split gauze drain sponge: 4 x 4 inches (10 x 10 cm).  Gauze square: 4 x 4 inches (10 x 10 cm). How to care for your surgical drain Care for your drain as told by your health care provider. This is important to help prevent infection. If your drain is placed at your back, or any other hard-to-reach area, ask another person to assist you in performing the following tasks: General care  Keep the skin around the drain dry and covered with a dressing at all times.  Check your drain area every day for signs of infection. Check for: ? Redness, swelling, or pain. ? Pus or a bad smell. ? Cloudy drainage. ? Tenderness or pressure at the drain exit site. Changing the dressing Follow instructions from your health care provider about how to change your dressing. Change your dressing at least once a day. Change it more often if needed to keep the dressing dry. Make sure you: 1. Gather your supplies. 2. Wash your hands with soap and water before you change your dressing. If soap and water are not available, use hand sanitizer. 3. Remove the old dressing. Avoid using scissors to do that. 4. Wash your hands with soap and water again after removing the old dressing. 5. Use sterile saline to clean your skin around the drain. You may need to use a cotton swab to clean the skin. 6. Place the tube through the slit in a drain sponge. Place the drain sponge so that it covers  your wound. 7. Place the gauze square or another drain sponge on top of the drain sponge that is on the wound. Make sure the tube is between those layers. 8. Tape the dressing to your skin. 9. Tape the drainage tube to your skin 1-2 inches (2.5-5 cm) below the place where the tube enters your body. Taping keeps the tube from pulling on any stitches (sutures) that you have. 10. Wash your hands with soap and water. 11. Write down the color of your drainage and how often you change your dressing. How to empty your active drain  1. Make sure that you have a measuring cup that you can empty your drainage into. 2. Wash your hands with soap and water. If soap and water are not available, use hand sanitizer. 3. Loosen any pins or clips that hold the tube in place. 4. If your health care  provider tells you to strip the tube to prevent clots and tube blockages: ? Hold the tube at the skin with one hand. Use your other hand to pinch the tubing with your thumb and first finger. ? Gently move your fingers down the tube while squeezing very lightly. This clears any drainage, clots, or tissue from the tube. ? You may need to do this several times each day to keep the tube clear. Do not pull on the tube. 5. Open the bulb cap or the drain plug. Do not touch the inside of the cap or the bottom of the plug. 6. Turn the device upside down and gently squeeze. 7. Empty all of the drainage into the measuring cup. 8. Compress the bulb or the container and replace the cap or the plug. To compress the bulb or the container, squeeze it firmly in the middle while you close the cap or plug the container. 9. Write down the amount of drainage that you have in each 24-hour period. If you have less than 2 Tbsp (30 mL) of drainage during 24 hours, contact your health care provider. 10. Flush the drainage down the toilet. 11. Wash your hands with soap and water. Contact a health care provider if:  You have redness, swelling,  or pain around your drain area.  You have pus or a bad smell coming from your drain area.  You have a fever or chills.  The skin around your drain is warm to the touch.  The amount of drainage that you have is increasing instead of decreasing.  You have drainage that is cloudy.  There is a sudden stop or a sudden decrease in the amount of drainage that you have.  Your drain tube falls out.  Your active drain does not stay compressed after you empty it. Summary  Surgical drains are used to remove extra fluid that normally builds up in a surgical wound after surgery.  Different kinds of surgical drains include active drains and passive drains. Active drains use suction to pull drainage away from the surgical wound, and passive drains allow fluid to drain naturally.  It is important to care for your drain to prevent infection. If your drain is placed at your back, or any other hard-to-reach area, ask another person to assist you.  Contact your health care provider if you have redness, swelling, or pain around your drain area. This information is not intended to replace advice given to you by your health care provider. Make sure you discuss any questions you have with your health care provider. Document Revised: 08/09/2018 Document Reviewed: 08/09/2018 Elsevier Patient Education  [place] Instructions  Activity: Get plenty of rest for the remainder of the day. A responsible individual must stay with you for 24 hours following the procedure.  For the next 24 hours, DO NOT: -Drive a car -Paediatric nurse -Drink alcoholic beverages -Take any medication unless instructed by your physician -Make any legal decisions or sign important papers.  Meals: Start with liquid foods such as gelatin or soup. Progress to regular foods as tolerated. Avoid greasy, spicy, heavy foods. If nausea and/or vomiting occur, drink only clear liquids until the nausea  and/or vomiting subsides. Call your physician if vomiting continues.  Special Instructions/Symptoms: Your throat may feel dry or sore from the anesthesia or the breathing tube placed in your throat during surgery. If this causes discomfort, gargle with warm salt water. The discomfort should disappear within 24  hours.

## 2019-08-21 NOTE — Anesthesia Procedure Notes (Signed)
Procedure Name: Intubation Date/Time: 08/21/2019 8:58 AM Performed by: Gerald Leitz, CRNA Pre-anesthesia Checklist: Patient identified, Patient being monitored, Timeout performed, Emergency Drugs available and Suction available Patient Re-evaluated:Patient Re-evaluated prior to induction Oxygen Delivery Method: Circle system utilized Preoxygenation: Pre-oxygenation with 100% oxygen Induction Type: IV induction Ventilation: Mask ventilation without difficulty Laryngoscope Size: Mac and 3 Grade View: Grade I Tube type: Oral Tube size: 7.0 mm Number of attempts: 1 Placement Confirmation: ETT inserted through vocal cords under direct vision,  positive ETCO2 and breath sounds checked- equal and bilateral Secured at: 21 cm Tube secured with: Tape Dental Injury: Teeth and Oropharynx as per pre-operative assessment

## 2019-08-21 NOTE — Transfer of Care (Signed)
  Immediate Anesthesia Transfer of Care Note  Patient: [name] [name]  Procedure(s) Performed: Procedure(s): LAPAROSCOPIC CHOLECYSTECTOMY (N/A)  Patient Location: PACU  Anesthesia Type:General  Level of Consciousness: Alert, Awake, Oriented  Airway & Oxygen Therapy: Patient Spontanous Breathing  Post-op Assessment: Report given to RN  Post vital signs: Reviewed and stable  Last Vitals:  Vitals:   08/21/19 0718  BP: 115/67  Pulse: 68  Resp: 16  Temp: 36.7 C  SpO2: 123456    Complications: No apparent anesthesia complications

## 2019-08-21 NOTE — Addendum Note (Signed)
Addendum  created 08/21/19 1539 by Wanita Chamberlain, CRNA   Charge Capture section accepted

## 2019-08-21 NOTE — H&P (Signed)
[name] [name] is an 79 y.o. female.   Chief Complaint: abdominal pain HPI: 79 yo female with 3 months of epigastric pain. SHe has been to the emergency room and diagnosed with cholecystitis. SHe continues to have attacks despite maximal dietary changes.  Past Medical History:  Diagnosis Date  . Anxiety   . Arthritis   . Cholecystitis   . Chronic constipation    takes magnesium  . Depression   . History of 2019 novel coronavirus disease (COVID-19)    last positive coivd test 07-24-2019 @ED  w/ fever, RUQ pain, N/V and dx cholecystitis  (08-17-2019 pt stated symptoms resolved when a week) /   pt stated she was dx covid 03-2019 symptoms fever and fatigue and resolved in 2 wks  . History of echocardiogram    07-28-2010 in epic,  showed ef 55-60%, G1DD, mild LAE, and mild AR, TR, MR (normal valves with no stenosis), PASP 24 mmHg  . History of TIA (transient ischemic attack)    01/ 2012  --- per pt no residual  . History of traumatic head injury    08-17-2019  per pt as child had serious head injury, hit in back of head, see knows she did not stay in the hospital but was seen @ED   . Hyperlipemia   . Hypertension    followed by pcp  (08-17-2019  per pt had a stress test several years ago done by Dr Wynonia Lawman, told normal, results not available in epic)  . Leg cramps    takes magnesium  . Stroke [facility]) 1/12   rt sides weakness-lasted 36hr    Past Surgical History:  Procedure Laterality Date  . APPENDECTOMY  age 11  . COLONOSCOPY    . EXPLORATORY LAPAROTOMY  age 78   removal ovarian cysts  . ORIF WRIST FRACTURE Right 03/02/2015   Procedure: OPEN REDUCTION INTERNAL FIXATION (ORIF) WRIST FRACTURE;  Surgeon: Iran Planas, MD;  Location: [place];  Service: Orthopedics;  Laterality: Right;  . TONSILLECTOMY  child  . TOTAL ABDOMINAL HYSTERECTOMY  1980   ovaries remain  . TRIGGER FINGER RELEASE  05/30/2012   Procedure: RELEASE TRIGGER FINGER/A-1 PULLEY;  Surgeon: Cammie Sickle., MD;   Location: [place];  Service: Orthopedics;  Laterality: Left;  Left Long, Ring, and Small finger A-1 Pulley Release    History reviewed. No pertinent family history. Social History:  reports that she quit smoking about 59 years ago. Her smoking use included cigarettes. She quit after 10.00 years of use. She has never used smokeless tobacco. She reports current alcohol use. She reports that she does not use drugs.  Allergies: No Active Allergies  Medications Prior to Admission  Medication Sig Dispense Refill  . amoxicillin-clavulanate (AUGMENTIN) 875-125 MG tablet Take 1 tablet by mouth every 12 (twelve) hours. 14 tablet 0  . esomeprazole (NEXIUM) 40 MG capsule Take 40 mg by mouth at bedtime.     . hydrochlorothiazide (MICROZIDE) 12.5 MG capsule Take 12.5 mg by mouth daily.     Marland Kitchen HYDROcodone-acetaminophen (NORCO/VICODIN) 5-325 MG tablet Take 1 tablet by mouth every 6 (six) hours as needed. 15 tablet 0  . ibuprofen (ADVIL) 200 MG tablet Take 400-600 mg by mouth every 6 (six) hours as needed for mild pain.    Marland Kitchen losartan (COZAAR) 100 MG tablet Take 100 mg by mouth daily.     Marland Kitchen MAGNESIUM PO Take 600 mg by mouth daily.    . ondansetron (ZOFRAN ODT) 8 MG disintegrating tablet Take  1 tablet (8 mg total) by mouth every 8 (eight) hours as needed for nausea or vomiting. 20 tablet 0  . ondansetron (ZOFRAN) 4 MG tablet Take 1 tablet (4 mg total) by mouth every 8 (eight) hours as needed for nausea or vomiting. 10 tablet 0  . vitamin C (ASCORBIC ACID) 500 MG tablet Take 1 tablet (500 mg total) by mouth daily. (Patient not taking: Reported on 07/24/2019) 50 tablet 0    Results for orders placed or performed during the hospital encounter of 08/21/19 (from the past 48 hour(s))  CBC WITH DIFFERENTIAL     Status: Abnormal   Collection Time: 08/20/19 11:42 AM  Result Value Ref Range   WBC 5.4 4.0 - 10.5 K/uL   RBC 3.79 (L) 3.87 - 5.11 MIL/uL   Hemoglobin 11.5 (L) 12.0 - 15.0 g/dL   HCT 36.3  36.0 - 46.0 %   MCV 95.8 80.0 - 100.0 fL   MCH 30.3 26.0 - 34.0 pg   MCHC 31.7 30.0 - 36.0 g/dL   RDW 12.8 11.5 - 15.5 %   Platelets 222 150 - 400 K/uL   nRBC 0.0 0.0 - 0.2 %   Neutrophils Relative % 73 %   Neutro Abs 4.0 1.7 - 7.7 K/uL   Lymphocytes Relative 16 %   Lymphs Abs 0.9 0.7 - 4.0 K/uL   Monocytes Relative 9 %   Monocytes Absolute 0.5 0.1 - 1.0 K/uL   Eosinophils Relative 1 %   Eosinophils Absolute 0.0 0.0 - 0.5 K/uL   Basophils Relative 1 %   Basophils Absolute 0.0 0.0 - 0.1 K/uL   Immature Granulocytes 0 %   Abs Immature Granulocytes 0.02 0.00 - 0.07 K/uL    Comment: Performed at [facility], [place] [address], [place], [place] [address]  Comprehensive metabolic panel     Status: Abnormal   Collection Time: 08/20/19 11:42 AM  Result Value Ref Range   Sodium 135 135 - 145 mmol/L   Potassium 4.5 3.5 - 5.1 mmol/L   Chloride 101 98 - 111 mmol/L   CO2 26 22 - 32 mmol/L   Glucose, Bld 107 (H) 70 - 99 mg/dL   BUN 20 8 - 23 mg/dL   Creatinine, Ser 1.41 (H) 0.44 - 1.00 mg/dL   Calcium 9.4 8.9 - 10.3 mg/dL   Total Protein 7.0 6.5 - 8.1 g/dL   Albumin 3.8 3.5 - 5.0 g/dL   AST 413 (H) 15 - 41 U/L   ALT 396 (H) 0 - 44 U/L   Alkaline Phosphatase 193 (H) 38 - 126 U/L   Total Bilirubin 2.2 (H) 0.3 - 1.2 mg/dL   GFR calc non Af Amer 36 (L) >60 mL/min   GFR calc Af Amer 42 (L) >60 mL/min   Anion gap 8 5 - 15    Comment: Performed at [facility], [place] [address] [place], [place] [address]   No results found.  Review of Systems  Constitutional: Negative for chills and fever.  HENT: Negative for hearing loss.   Respiratory: Negative for cough.   Cardiovascular: Negative for chest pain and palpitations.  Gastrointestinal: Positive for abdominal pain. Negative for nausea and vomiting.  Genitourinary: Negative for dysuria and urgency.  Musculoskeletal: Negative for myalgias and neck pain.  Skin: Negative for rash.  Neurological: Negative  for dizziness and headaches.  Hematological: Does not bruise/bleed easily.  Psychiatric/Behavioral: Negative for suicidal ideas.    Blood pressure 115/67, pulse 68, temperature 98 F (36.7  C), temperature source Oral, resp. rate 16, height 5\' 3"  (1.6 m), weight 68.4 kg, SpO2 99 %. Physical Exam  Vitals reviewed. Constitutional: She is oriented to person, place, and time. She appears well-developed and well-nourished.  HENT:  Head: Normocephalic and atraumatic.  Eyes: Pupils are equal, round, and reactive to light. Conjunctivae and EOM are normal.  Cardiovascular: Normal rate and regular rhythm.  Respiratory: Effort normal and breath sounds normal.  GI: Soft. Bowel sounds are normal. She exhibits no distension. There is no abdominal tenderness.  Musculoskeletal:        General: Normal range of motion.     Cervical back: Normal range of motion and neck supple.  Neurological: She is alert and oriented to person, place, and time.  Skin: Skin is warm and dry.  Psychiatric: She has a normal mood and affect. Her behavior is normal.     Assessment/Plan 79 yo female with cholecystitis -lap chole -ERAS protocol -planned outpatient procedure  Mickeal Skinner, MD 08/21/2019, 8:50 AM

## 2019-08-21 NOTE — Anesthesia Postprocedure Evaluation (Signed)
Anesthesia Post Note  Patient: [name] [name]  Procedure(s) Performed: LAPAROSCOPIC CHOLECYSTECTOMY (N/A Abdomen)     Patient location during evaluation: PACU Anesthesia Type: General Level of consciousness: awake and alert Pain management: pain level controlled Vital Signs Assessment: post-procedure vital signs reviewed and stable Respiratory status: spontaneous breathing, nonlabored ventilation, respiratory function stable and patient connected to nasal cannula oxygen Cardiovascular status: blood pressure returned to baseline and stable Postop Assessment: no apparent nausea or vomiting Anesthetic complications: no    Last Vitals:  Vitals:   08/21/19 1245 08/21/19 1400  BP: (!) 115/56 (!) 127/50  Pulse: (!) 50 66  Resp: 12 14  Temp: 36.4 C (!) 36.4 C  SpO2: 100% 98%    Last Pain:  Vitals:   08/21/19 1400  TempSrc:   PainSc: 4                  Barnet Glasgow

## 2019-08-22 LAB — SURGICAL PATHOLOGY

## 2019-10-23 ENCOUNTER — Other Ambulatory Visit (HOSPITAL_COMMUNITY): Payer: Self-pay | Admitting: Family Medicine

## 2019-10-23 DIAGNOSIS — I351 Nonrheumatic aortic (valve) insufficiency: Secondary | ICD-10-CM

## 2019-11-07 ENCOUNTER — Ambulatory Visit (HOSPITAL_COMMUNITY): Payer: Medicare Other | Attending: Cardiology

## 2019-11-07 ENCOUNTER — Other Ambulatory Visit: Payer: Self-pay

## 2019-11-07 DIAGNOSIS — I351 Nonrheumatic aortic (valve) insufficiency: Secondary | ICD-10-CM | POA: Diagnosis not present

## 2020-07-22 DIAGNOSIS — R059 Cough, unspecified: Secondary | ICD-10-CM | POA: Diagnosis not present

## 2020-07-24 DIAGNOSIS — R051 Acute cough: Secondary | ICD-10-CM | POA: Diagnosis not present

## 2020-07-24 DIAGNOSIS — Z20822 Contact with and (suspected) exposure to covid-19: Secondary | ICD-10-CM | POA: Diagnosis not present

## 2020-07-25 DIAGNOSIS — R059 Cough, unspecified: Secondary | ICD-10-CM | POA: Diagnosis not present

## 2020-09-05 DIAGNOSIS — N3000 Acute cystitis without hematuria: Secondary | ICD-10-CM | POA: Diagnosis not present

## 2020-09-05 DIAGNOSIS — R350 Frequency of micturition: Secondary | ICD-10-CM | POA: Diagnosis not present

## 2020-09-10 DIAGNOSIS — H31092 Other chorioretinal scars, left eye: Secondary | ICD-10-CM | POA: Diagnosis not present

## 2020-09-10 DIAGNOSIS — D3131 Benign neoplasm of right choroid: Secondary | ICD-10-CM | POA: Diagnosis not present

## 2020-09-10 DIAGNOSIS — H43823 Vitreomacular adhesion, bilateral: Secondary | ICD-10-CM | POA: Diagnosis not present

## 2020-09-10 DIAGNOSIS — H2513 Age-related nuclear cataract, bilateral: Secondary | ICD-10-CM | POA: Diagnosis not present

## 2020-10-16 ENCOUNTER — Other Ambulatory Visit: Payer: Self-pay | Admitting: Obstetrics and Gynecology

## 2020-10-16 DIAGNOSIS — Z1231 Encounter for screening mammogram for malignant neoplasm of breast: Secondary | ICD-10-CM

## 2020-11-21 ENCOUNTER — Encounter (HOSPITAL_COMMUNITY): Payer: Self-pay | Admitting: Physician Assistant

## 2020-11-21 ENCOUNTER — Ambulatory Visit (HOSPITAL_COMMUNITY)
Admission: EM | Admit: 2020-11-21 | Discharge: 2020-11-21 | Disposition: A | Discharge status: Home / Self Care | Payer: Medicare Other | Attending: Internal Medicine | Admitting: Internal Medicine

## 2020-11-21 ENCOUNTER — Other Ambulatory Visit: Payer: Self-pay

## 2020-11-21 DIAGNOSIS — R52 Pain, unspecified: Secondary | ICD-10-CM | POA: Diagnosis not present

## 2020-11-21 DIAGNOSIS — Z87891 Personal history of nicotine dependence: Secondary | ICD-10-CM | POA: Diagnosis not present

## 2020-11-21 DIAGNOSIS — B349 Viral infection, unspecified: Secondary | ICD-10-CM | POA: Diagnosis not present

## 2020-11-21 DIAGNOSIS — Z28310 Unvaccinated for covid-19: Secondary | ICD-10-CM | POA: Insufficient documentation

## 2020-11-21 DIAGNOSIS — R509 Fever, unspecified: Secondary | ICD-10-CM | POA: Diagnosis not present

## 2020-11-21 DIAGNOSIS — U071 COVID-19: Secondary | ICD-10-CM | POA: Diagnosis not present

## 2020-11-21 LAB — POC INFLUENZA A AND B ANTIGEN (URGENT CARE ONLY)
INFLUENZA A ANTIGEN, POC: NEGATIVE
INFLUENZA B ANTIGEN, POC: NEGATIVE

## 2020-11-21 MED ORDER — BENZONATATE 100 MG PO CAPS: 100.0000 mg | ORAL_CAPSULE | Freq: Three times a day (TID) | ORAL | 0 refills | Status: AC

## 2020-11-21 NOTE — ED Triage Notes (Signed)
Pt reports cough, body aches, nausea (w/o vomiting), fever, chest tightness (denies pain), shob, decreased PO intake since Sunday.

## 2020-11-21 NOTE — Discharge Instructions (Addendum)
Your flu test was negative.  We are waiting for your COVID-19 results.  I recommend alternating Tylenol and ibuprofen to manage your fever and pain.  I have called in a cough medicine to help with your symptoms.  If you have any worsening symptoms please return for reevaluation.

## 2020-11-21 NOTE — ED Notes (Signed)
When reviewing medical history pt states "theyre looking at a mass on my ovary".

## 2020-11-21 NOTE — ED Provider Notes (Signed)
[facility]    CSN: [id number] Arrival date & time: 11/21/20  1513      History   Chief Complaint Chief Complaint  Patient presents with  . Cough  . Generalized Body Aches  . Nausea  . Fever  . decreased intake  . Chest Pain    HPI [name] [name] is a 80 y.o. female.   Patient presents today with a 5-day history of cough.  Reports associated fever with highest recorded 101 F, body aches, headache, fatigue, nausea, malaise, shortness of breath.  Denies any chest pain, dizziness, syncope, nausea, vomiting.  She has not tried any over-the-counter medications for symptom management.  She denies any known sick contacts.  She has not had influenza or COVID-19 vaccinations.  Denies any recent antibiotic use.  Denies history of asthma, COPD, allergies.  Reports history of smoking but quit many many years ago.  She has had COVID-19 last year but states current symptoms are more extreme than previous episodes of this condition.  She denies any recent travel.  She is having difficulty with daily activities as a result of symptoms.     Past Medical History:  Diagnosis Date  . Anxiety   . Arthritis   . Cholecystitis   . Chronic constipation    takes magnesium  . Depression   . History of 2019 novel coronavirus disease (COVID-19)    last positive coivd test 07-24-2019 @ED  w/ fever, RUQ pain, N/V and dx cholecystitis  (08-17-2019 pt stated symptoms resolved when a week) /   pt stated she was dx covid 03-2019 symptoms fever and fatigue and resolved in 2 wks  . History of echocardiogram    07-28-2010 in epic,  showed ef 55-60%, G1DD, mild LAE, and mild AR, TR, MR (normal valves with no stenosis), PASP 24 mmHg  . History of TIA (transient ischemic attack)    01/ 2012  --- per pt no residual  . History of traumatic head injury    08-17-2019  per pt as child had serious head injury, hit in back of head, see knows she did not stay in the hospital but was seen @ED   . Hyperlipemia    . Hypertension    followed by pcp  (08-17-2019  per pt had a stress test several years ago done by Dr Wynonia Lawman, told normal, results not available in epic)  . Leg cramps    takes magnesium  . Stroke [facility]) 1/12   rt sides weakness-lasted 36hr    Patient Active Problem List   Diagnosis Date Noted  . UNSPECIFIED TRANSIENT CEREBRAL ISCHEMIA 07/28/2010    Past Surgical History:  Procedure Laterality Date  . APPENDECTOMY  age 22  . CHOLECYSTECTOMY N/A 08/21/2019   Procedure: LAPAROSCOPIC CHOLECYSTECTOMY;  Surgeon: Kinsinger, Arta Bruce, MD;  Location: [facility];  Service: General;  Laterality: N/A;  . COLONOSCOPY    . EXPLORATORY LAPAROTOMY  age 63   removal ovarian cysts  . ORIF WRIST FRACTURE Right 03/02/2015   Procedure: OPEN REDUCTION INTERNAL FIXATION (ORIF) WRIST FRACTURE;  Surgeon: Iran Planas, MD;  Location: [place];  Service: Orthopedics;  Laterality: Right;  . TONSILLECTOMY  child  . TOTAL ABDOMINAL HYSTERECTOMY  1980   ovaries remain  . TRIGGER FINGER RELEASE  05/30/2012   Procedure: RELEASE TRIGGER FINGER/A-1 PULLEY;  Surgeon: Cammie Sickle., MD;  Location: [place];  Service: Orthopedics;  Laterality: Left;  Left Long, Ring, and Small finger A-1 Pulley Release  OB History   No obstetric history on file.      Home Medications    Prior to Admission medications   Medication Sig Start Date End Date Taking? Authorizing Provider  benzonatate (TESSALON) 100 MG capsule Take 1 capsule (100 mg total) by mouth every 8 (eight) hours. 11/21/20  Yes Zoei Amison K, PA-C  esomeprazole (NEXIUM) 40 MG capsule Take 40 mg by mouth at bedtime.  03/02/19   [provider]  hydrochlorothiazide (MICROZIDE) 12.5 MG capsule Take 12.5 mg by mouth daily.     [provider]  ibuprofen (ADVIL) 800 MG tablet Take 1 tablet (800 mg total) by mouth every 8 (eight) hours as needed. 08/21/19   Kinsinger, Arta Bruce, MD  losartan (COZAAR) 100 MG tablet  Take 100 mg by mouth daily.     [provider]  MAGNESIUM PO Take 600 mg by mouth daily.    [provider]  ondansetron (ZOFRAN ODT) 8 MG disintegrating tablet Take 1 tablet (8 mg total) by mouth every 8 (eight) hours as needed for nausea or vomiting. 07/21/19   Pattricia Boss, MD  ondansetron (ZOFRAN) 4 MG tablet Take 1 tablet (4 mg total) by mouth every 8 (eight) hours as needed for nausea or vomiting. 07/24/19   Rolland Porter, MD  oxyCODONE (OXY IR/ROXICODONE) 5 MG immediate release tablet Take 1 tablet (5 mg total) by mouth every 6 (six) hours as needed for severe pain. 08/21/19   Kinsinger, Arta Bruce, MD  vitamin C (ASCORBIC ACID) 500 MG tablet Take 1 tablet (500 mg total) by mouth daily. Patient not taking: Reported on 07/24/2019 03/02/15   Iran Planas, MD    Family History History reviewed. No pertinent family history.  Social History Social History   Tobacco Use  . Smoking status: Former Smoker    Years: 10.00    Types: Cigarettes    Quit date: 08/16/1960    Years since quitting: 60.3  . Smokeless tobacco: Never Used  Vaping Use  . Vaping Use: Never used  Substance Use Topics  . Alcohol use: Yes    Comment: rare  . Drug use: Never     Allergies   Codeine and Phenergan [promethazine]   Review of Systems Review of Systems  Constitutional: Positive for activity change, appetite change, fatigue and fever.  HENT: Positive for congestion. Negative for sinus pressure, sneezing and sore throat.   Respiratory: Positive for cough and shortness of breath.   Cardiovascular: Negative for chest pain.  Gastrointestinal: Negative for abdominal pain, diarrhea, nausea and vomiting.  Musculoskeletal: Positive for arthralgias and myalgias.  Neurological: Positive for headaches. Negative for dizziness and light-headedness.     Physical Exam Triage Vital Signs ED Triage Vitals  Enc Vitals Group     BP 11/21/20 1635 (!) 141/68     Pulse Rate 11/21/20 1635 66     Resp  11/21/20 1635 18     Temp 11/21/20 1635 97.6 F (36.4 C)     Temp src --      SpO2 11/21/20 1635 99 %     Weight --      Height --      Head Circumference --      Peak Flow --      Pain Score 11/21/20 1629 0     Pain Loc --      Pain Edu? --      Excl. in Needles? --    No data found.  Updated Vital Signs BP Marland Kitchen)  141/68   Pulse 66   Temp 97.6 F (36.4 C)   Resp 18   SpO2 99%   Visual Acuity Right Eye Distance:   Left Eye Distance:   Bilateral Distance:    Right Eye Near:   Left Eye Near:    Bilateral Near:     Physical Exam Vitals reviewed.  Constitutional:      General: She is awake. She is not in acute distress.    Appearance: Normal appearance. She is not ill-appearing.     Comments: Very pleasant female appears stated age in no acute distress  HENT:     Head: Normocephalic and atraumatic.     Right Ear: Tympanic membrane, ear canal and external ear normal. Tympanic membrane is not erythematous or bulging.     Left Ear: Tympanic membrane, ear canal and external ear normal. Tympanic membrane is not erythematous or bulging.     Nose:     Right Sinus: No maxillary sinus tenderness or frontal sinus tenderness.     Left Sinus: No maxillary sinus tenderness or frontal sinus tenderness.     Mouth/Throat:     Pharynx: Uvula midline. No oropharyngeal exudate or posterior oropharyngeal erythema.  Cardiovascular:     Rate and Rhythm: Normal rate and regular rhythm.     Heart sounds: No murmur heard.   Pulmonary:     Effort: Pulmonary effort is normal.     Breath sounds: Normal breath sounds. No wheezing, rhonchi or rales.     Comments: Clear to auscultation bilaterally Lymphadenopathy:     Head:     Right side of head: No submental, submandibular or tonsillar adenopathy.     Left side of head: No submental, submandibular or tonsillar adenopathy.     Cervical: No cervical adenopathy.  Psychiatric:        Behavior: Behavior is cooperative.      UC Treatments /  Results  Labs (all labs ordered are listed, but only abnormal results are displayed) Labs Reviewed  SARS CORONAVIRUS 2 (TAT 6-24 HRS)  POC INFLUENZA A AND B ANTIGEN (URGENT CARE ONLY)    EKG   Radiology No results found.  Procedures Procedures (including critical care time)  Medications Ordered in UC Medications - No data to display  Initial Impression / Assessment and Plan / UC Course  I have reviewed the triage vital signs and the nursing notes.  Pertinent labs & imaging results that were available during my care of the patient were reviewed by me and considered in my medical decision making (see chart for details).     EKG obtained given shortness of breath showed normal sinus rhythm with ventricular rate of 64 bpm and no acute changes unchanged from previous tracing from 07/24/2019.  Flu testing was negative and COVID-19 testing is pending.  Suspect viral etiology given short duration of symptoms.  No evidence of acute infection on physical exam but would warrant initiation of antibiotics.  Patient was prescribed Tessalon to help with cough symptoms.  Recommend she use Mucinex and Flonase for additional symptom relief.  Strict return precautions given to which patient expressed understanding.  Final Clinical Impressions(s) / UC Diagnoses   Final diagnoses:  Viral illness  Fever, unspecified  Body aches     Discharge Instructions     Your flu test was negative.  We are waiting for your COVID-19 results.  I recommend alternating Tylenol and ibuprofen to manage your fever and pain.  I have called in a cough medicine  to help with your symptoms.  If you have any worsening symptoms please return for reevaluation.    ED Prescriptions    Medication Sig Dispense Auth. Provider   benzonatate (TESSALON) 100 MG capsule Take 1 capsule (100 mg total) by mouth every 8 (eight) hours. 21 capsule Ramia Sidney K, PA-C     PDMP not reviewed this encounter.   Terrilee Croak,  PA-C 11/21/20 1757

## 2020-11-22 LAB — SARS CORONAVIRUS 2 (TAT 6-24 HRS): SARS Coronavirus 2: POSITIVE — AB

## 2020-12-06 ENCOUNTER — Other Ambulatory Visit: Payer: Self-pay

## 2020-12-06 ENCOUNTER — Ambulatory Visit
Admission: RE | Admit: 2020-12-06 | Discharge: 2020-12-06 | Disposition: A | Discharge status: Home / Self Care | Payer: Medicare Other | Source: Ambulatory Visit | Attending: Obstetrics and Gynecology | Admitting: Obstetrics and Gynecology

## 2020-12-06 DIAGNOSIS — Z1231 Encounter for screening mammogram for malignant neoplasm of breast: Secondary | ICD-10-CM | POA: Diagnosis not present

## 2020-12-08 ENCOUNTER — Ambulatory Visit: Payer: Medicare Other

## 2020-12-29 DIAGNOSIS — H2513 Age-related nuclear cataract, bilateral: Secondary | ICD-10-CM | POA: Diagnosis not present

## 2020-12-29 DIAGNOSIS — H43823 Vitreomacular adhesion, bilateral: Secondary | ICD-10-CM | POA: Diagnosis not present

## 2020-12-29 DIAGNOSIS — D3131 Benign neoplasm of right choroid: Secondary | ICD-10-CM | POA: Diagnosis not present

## 2020-12-29 DIAGNOSIS — H16223 Keratoconjunctivitis sicca, not specified as Sjogren's, bilateral: Secondary | ICD-10-CM | POA: Diagnosis not present

## 2020-12-29 DIAGNOSIS — H31092 Other chorioretinal scars, left eye: Secondary | ICD-10-CM | POA: Diagnosis not present

## 2021-01-05 DIAGNOSIS — N390 Urinary tract infection, site not specified: Secondary | ICD-10-CM | POA: Diagnosis not present

## 2021-01-08 DIAGNOSIS — K219 Gastro-esophageal reflux disease without esophagitis: Secondary | ICD-10-CM | POA: Diagnosis not present

## 2021-01-08 DIAGNOSIS — G4700 Insomnia, unspecified: Secondary | ICD-10-CM | POA: Diagnosis not present

## 2021-01-08 DIAGNOSIS — E782 Mixed hyperlipidemia: Secondary | ICD-10-CM | POA: Diagnosis not present

## 2021-01-08 DIAGNOSIS — I10 Essential (primary) hypertension: Secondary | ICD-10-CM | POA: Diagnosis not present

## 2021-01-08 DIAGNOSIS — G459 Transient cerebral ischemic attack, unspecified: Secondary | ICD-10-CM | POA: Diagnosis not present

## 2021-01-08 DIAGNOSIS — E785 Hyperlipidemia, unspecified: Secondary | ICD-10-CM | POA: Diagnosis not present

## 2021-01-08 DIAGNOSIS — G43009 Migraine without aura, not intractable, without status migrainosus: Secondary | ICD-10-CM | POA: Diagnosis not present

## 2021-01-28 DIAGNOSIS — H2513 Age-related nuclear cataract, bilateral: Secondary | ICD-10-CM | POA: Diagnosis not present

## 2021-01-28 DIAGNOSIS — H16223 Keratoconjunctivitis sicca, not specified as Sjogren's, bilateral: Secondary | ICD-10-CM | POA: Diagnosis not present

## 2021-01-28 DIAGNOSIS — D3131 Benign neoplasm of right choroid: Secondary | ICD-10-CM | POA: Diagnosis not present

## 2021-01-28 DIAGNOSIS — H31092 Other chorioretinal scars, left eye: Secondary | ICD-10-CM | POA: Diagnosis not present

## 2021-01-28 DIAGNOSIS — H43823 Vitreomacular adhesion, bilateral: Secondary | ICD-10-CM | POA: Diagnosis not present

## 2021-02-02 DIAGNOSIS — I700 Atherosclerosis of aorta: Secondary | ICD-10-CM | POA: Diagnosis not present

## 2021-02-02 DIAGNOSIS — Z1389 Encounter for screening for other disorder: Secondary | ICD-10-CM | POA: Diagnosis not present

## 2021-02-02 DIAGNOSIS — Z1159 Encounter for screening for other viral diseases: Secondary | ICD-10-CM | POA: Diagnosis not present

## 2021-02-02 DIAGNOSIS — G4700 Insomnia, unspecified: Secondary | ICD-10-CM | POA: Diagnosis not present

## 2021-02-02 DIAGNOSIS — E782 Mixed hyperlipidemia: Secondary | ICD-10-CM | POA: Diagnosis not present

## 2021-02-02 DIAGNOSIS — Z0000 Encounter for general adult medical examination without abnormal findings: Secondary | ICD-10-CM | POA: Diagnosis not present

## 2021-02-02 DIAGNOSIS — I10 Essential (primary) hypertension: Secondary | ICD-10-CM | POA: Diagnosis not present

## 2021-03-27 DIAGNOSIS — H25812 Combined forms of age-related cataract, left eye: Secondary | ICD-10-CM | POA: Diagnosis not present

## 2021-04-29 DIAGNOSIS — H2511 Age-related nuclear cataract, right eye: Secondary | ICD-10-CM | POA: Diagnosis not present

## 2021-05-01 DIAGNOSIS — H25811 Combined forms of age-related cataract, right eye: Secondary | ICD-10-CM | POA: Diagnosis not present

## 2021-08-11 DIAGNOSIS — I10 Essential (primary) hypertension: Secondary | ICD-10-CM | POA: Diagnosis not present

## 2021-08-11 DIAGNOSIS — E785 Hyperlipidemia, unspecified: Secondary | ICD-10-CM | POA: Diagnosis not present

## 2021-08-11 DIAGNOSIS — I700 Atherosclerosis of aorta: Secondary | ICD-10-CM | POA: Diagnosis not present

## 2021-08-11 DIAGNOSIS — K219 Gastro-esophageal reflux disease without esophagitis: Secondary | ICD-10-CM | POA: Diagnosis not present

## 2021-08-19 DIAGNOSIS — N644 Mastodynia: Secondary | ICD-10-CM | POA: Diagnosis not present

## 2021-08-19 DIAGNOSIS — L299 Pruritus, unspecified: Secondary | ICD-10-CM | POA: Diagnosis not present

## 2021-08-22 ENCOUNTER — Other Ambulatory Visit: Payer: Self-pay | Admitting: Obstetrics and Gynecology

## 2021-08-22 DIAGNOSIS — N644 Mastodynia: Secondary | ICD-10-CM

## 2021-09-21 ENCOUNTER — Ambulatory Visit
Admission: RE | Admit: 2021-09-21 | Discharge: 2021-09-21 | Disposition: A | Discharge status: Home / Self Care | Payer: Medicare Other | Source: Ambulatory Visit | Attending: Obstetrics and Gynecology | Admitting: Obstetrics and Gynecology

## 2021-09-21 ENCOUNTER — Ambulatory Visit: Payer: Medicare Other

## 2021-09-21 DIAGNOSIS — R928 Other abnormal and inconclusive findings on diagnostic imaging of breast: Secondary | ICD-10-CM | POA: Diagnosis not present

## 2021-09-21 DIAGNOSIS — N644 Mastodynia: Secondary | ICD-10-CM

## 2021-09-23 DIAGNOSIS — R21 Rash and other nonspecific skin eruption: Secondary | ICD-10-CM | POA: Diagnosis not present

## 2021-11-12 DIAGNOSIS — S30861A Insect bite (nonvenomous) of abdominal wall, initial encounter: Secondary | ICD-10-CM | POA: Diagnosis not present

## 2021-11-12 DIAGNOSIS — W57XXXA Bitten or stung by nonvenomous insect and other nonvenomous arthropods, initial encounter: Secondary | ICD-10-CM | POA: Diagnosis not present

## 2021-11-24 DIAGNOSIS — D3131 Benign neoplasm of right choroid: Secondary | ICD-10-CM | POA: Diagnosis not present

## 2021-11-24 DIAGNOSIS — Z961 Presence of intraocular lens: Secondary | ICD-10-CM | POA: Diagnosis not present

## 2021-11-24 DIAGNOSIS — H43823 Vitreomacular adhesion, bilateral: Secondary | ICD-10-CM | POA: Diagnosis not present

## 2022-03-10 DIAGNOSIS — K219 Gastro-esophageal reflux disease without esophagitis: Secondary | ICD-10-CM | POA: Diagnosis not present

## 2022-03-10 DIAGNOSIS — E785 Hyperlipidemia, unspecified: Secondary | ICD-10-CM | POA: Diagnosis not present

## 2022-03-10 DIAGNOSIS — I10 Essential (primary) hypertension: Secondary | ICD-10-CM | POA: Diagnosis not present

## 2022-03-10 DIAGNOSIS — I700 Atherosclerosis of aorta: Secondary | ICD-10-CM | POA: Diagnosis not present

## 2022-03-10 DIAGNOSIS — Z0000 Encounter for general adult medical examination without abnormal findings: Secondary | ICD-10-CM | POA: Diagnosis not present

## 2022-09-08 ENCOUNTER — Other Ambulatory Visit: Payer: Self-pay | Admitting: Family Medicine

## 2022-09-08 DIAGNOSIS — Z1231 Encounter for screening mammogram for malignant neoplasm of breast: Secondary | ICD-10-CM

## 2022-09-08 DIAGNOSIS — Z1239 Encounter for other screening for malignant neoplasm of breast: Secondary | ICD-10-CM | POA: Diagnosis not present

## 2022-09-08 DIAGNOSIS — I700 Atherosclerosis of aorta: Secondary | ICD-10-CM | POA: Diagnosis not present

## 2022-09-08 DIAGNOSIS — I10 Essential (primary) hypertension: Secondary | ICD-10-CM | POA: Diagnosis not present

## 2022-09-08 DIAGNOSIS — R350 Frequency of micturition: Secondary | ICD-10-CM | POA: Diagnosis not present

## 2022-09-08 DIAGNOSIS — N390 Urinary tract infection, site not specified: Secondary | ICD-10-CM | POA: Diagnosis not present

## 2022-09-08 DIAGNOSIS — E782 Mixed hyperlipidemia: Secondary | ICD-10-CM | POA: Diagnosis not present

## 2022-09-08 DIAGNOSIS — E2839 Other primary ovarian failure: Secondary | ICD-10-CM

## 2022-09-08 DIAGNOSIS — K219 Gastro-esophageal reflux disease without esophagitis: Secondary | ICD-10-CM | POA: Diagnosis not present

## 2022-11-30 DIAGNOSIS — Z961 Presence of intraocular lens: Secondary | ICD-10-CM | POA: Diagnosis not present

## 2022-11-30 DIAGNOSIS — D3131 Benign neoplasm of right choroid: Secondary | ICD-10-CM | POA: Diagnosis not present

## 2022-11-30 DIAGNOSIS — H43823 Vitreomacular adhesion, bilateral: Secondary | ICD-10-CM | POA: Diagnosis not present

## 2022-12-03 DIAGNOSIS — N8111 Cystocele, midline: Secondary | ICD-10-CM | POA: Diagnosis not present

## 2022-12-16 DIAGNOSIS — M7660 Achilles tendinitis, unspecified leg: Secondary | ICD-10-CM | POA: Diagnosis not present

## 2023-02-08 ENCOUNTER — Inpatient Hospital Stay: Admission: RE | Admit: 2023-02-08 | Payer: Medicare Other | Source: Ambulatory Visit

## 2023-02-08 ENCOUNTER — Ambulatory Visit: Payer: Medicare Other

## 2023-03-29 DIAGNOSIS — N8110 Cystocele, unspecified: Secondary | ICD-10-CM | POA: Diagnosis not present

## 2023-03-31 ENCOUNTER — Other Ambulatory Visit: Payer: Self-pay | Admitting: Family Medicine

## 2023-03-31 DIAGNOSIS — Z1231 Encounter for screening mammogram for malignant neoplasm of breast: Secondary | ICD-10-CM | POA: Diagnosis not present

## 2023-03-31 DIAGNOSIS — Z0000 Encounter for general adult medical examination without abnormal findings: Secondary | ICD-10-CM | POA: Diagnosis not present

## 2023-03-31 DIAGNOSIS — I10 Essential (primary) hypertension: Secondary | ICD-10-CM | POA: Diagnosis not present

## 2023-03-31 DIAGNOSIS — E2839 Other primary ovarian failure: Secondary | ICD-10-CM

## 2023-03-31 DIAGNOSIS — K219 Gastro-esophageal reflux disease without esophagitis: Secondary | ICD-10-CM | POA: Diagnosis not present

## 2023-03-31 DIAGNOSIS — I700 Atherosclerosis of aorta: Secondary | ICD-10-CM | POA: Diagnosis not present

## 2023-03-31 DIAGNOSIS — E782 Mixed hyperlipidemia: Secondary | ICD-10-CM | POA: Diagnosis not present

## 2023-03-31 DIAGNOSIS — G4700 Insomnia, unspecified: Secondary | ICD-10-CM | POA: Diagnosis not present

## 2023-05-05 ENCOUNTER — Ambulatory Visit
Admission: RE | Admit: 2023-05-05 | Discharge: 2023-05-05 | Disposition: A | Discharge status: Home / Self Care | Payer: Medicare Other | Source: Ambulatory Visit | Attending: Family Medicine | Admitting: Family Medicine

## 2023-05-05 DIAGNOSIS — Z1231 Encounter for screening mammogram for malignant neoplasm of breast: Secondary | ICD-10-CM

## 2023-06-06 IMAGING — MG MM DIGITAL DIAGNOSTIC UNILAT*R* W/ TOMO W/ CAD
4 series · 4 of 12 positions shown · non-contrast
Comparison: Previous exam(s).

ACR Breast Density Category a: The breast tissue is almost entirely
fatty.

CLINICAL DATA: Patient reports itchy RIGHT nipple, associated with
redness. She denies any scaling or scab of the skin of the RIGHT
nipple. Patient was seen by her provider and was given a topical
medication. The patient used medication a few times and then noted
complete resolution of her symptoms. Patient is asymptomatic today.

EXAM:
DIGITAL DIAGNOSTIC UNILATERAL RIGHT MAMMOGRAM WITH TOMOSYNTHESIS AND
CAD
TECHNIQUE: Right digital diagnostic mammography and breast tomosynthesis was
performed. The images were evaluated with computer-aided detection.

[R MLO synth-2D]
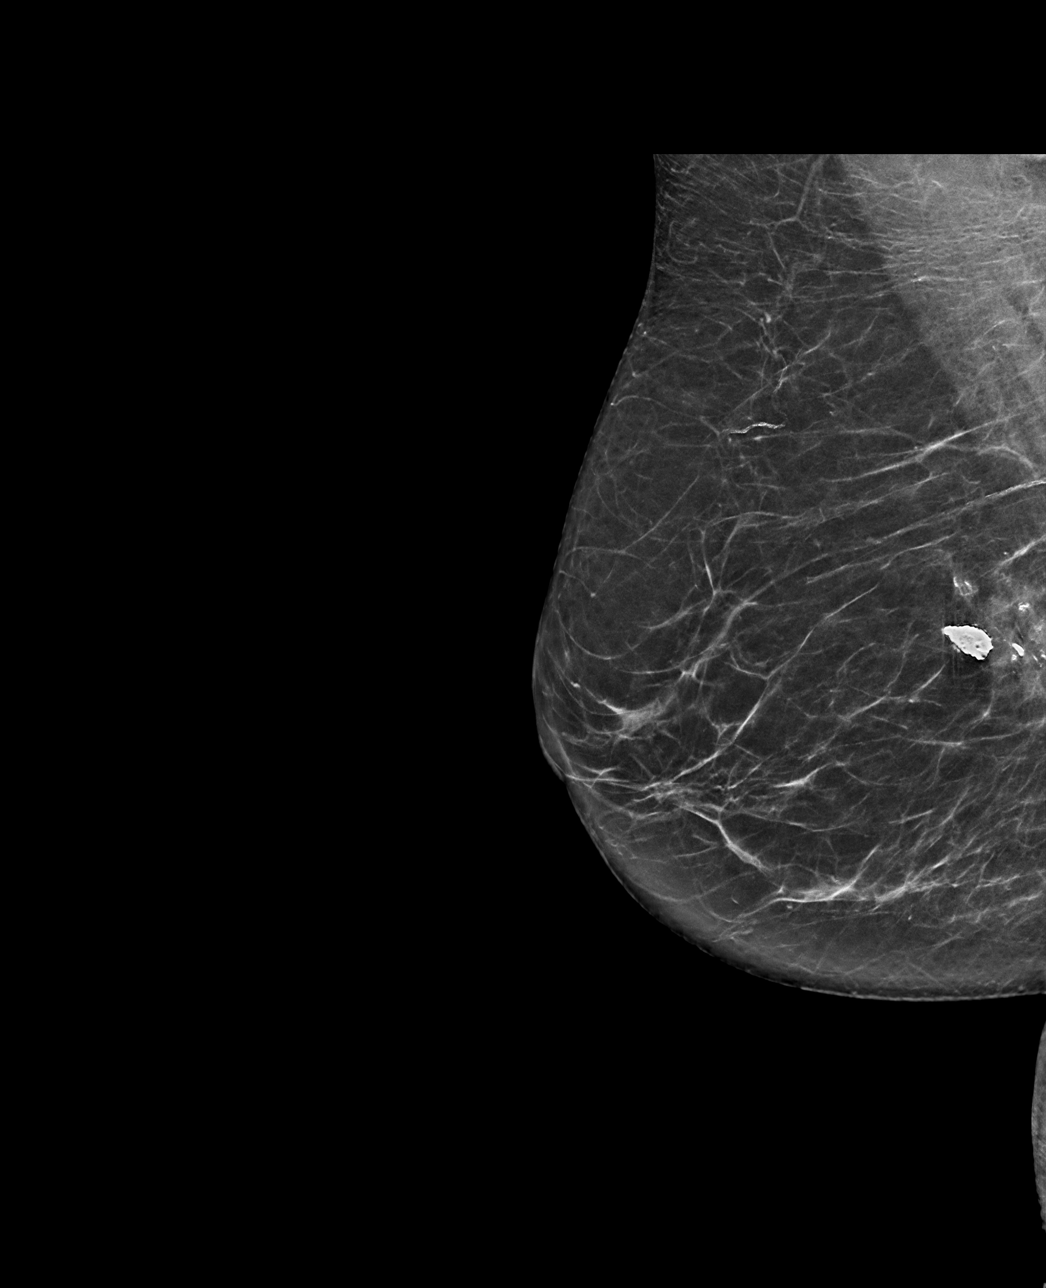

[R CC synth-2D]
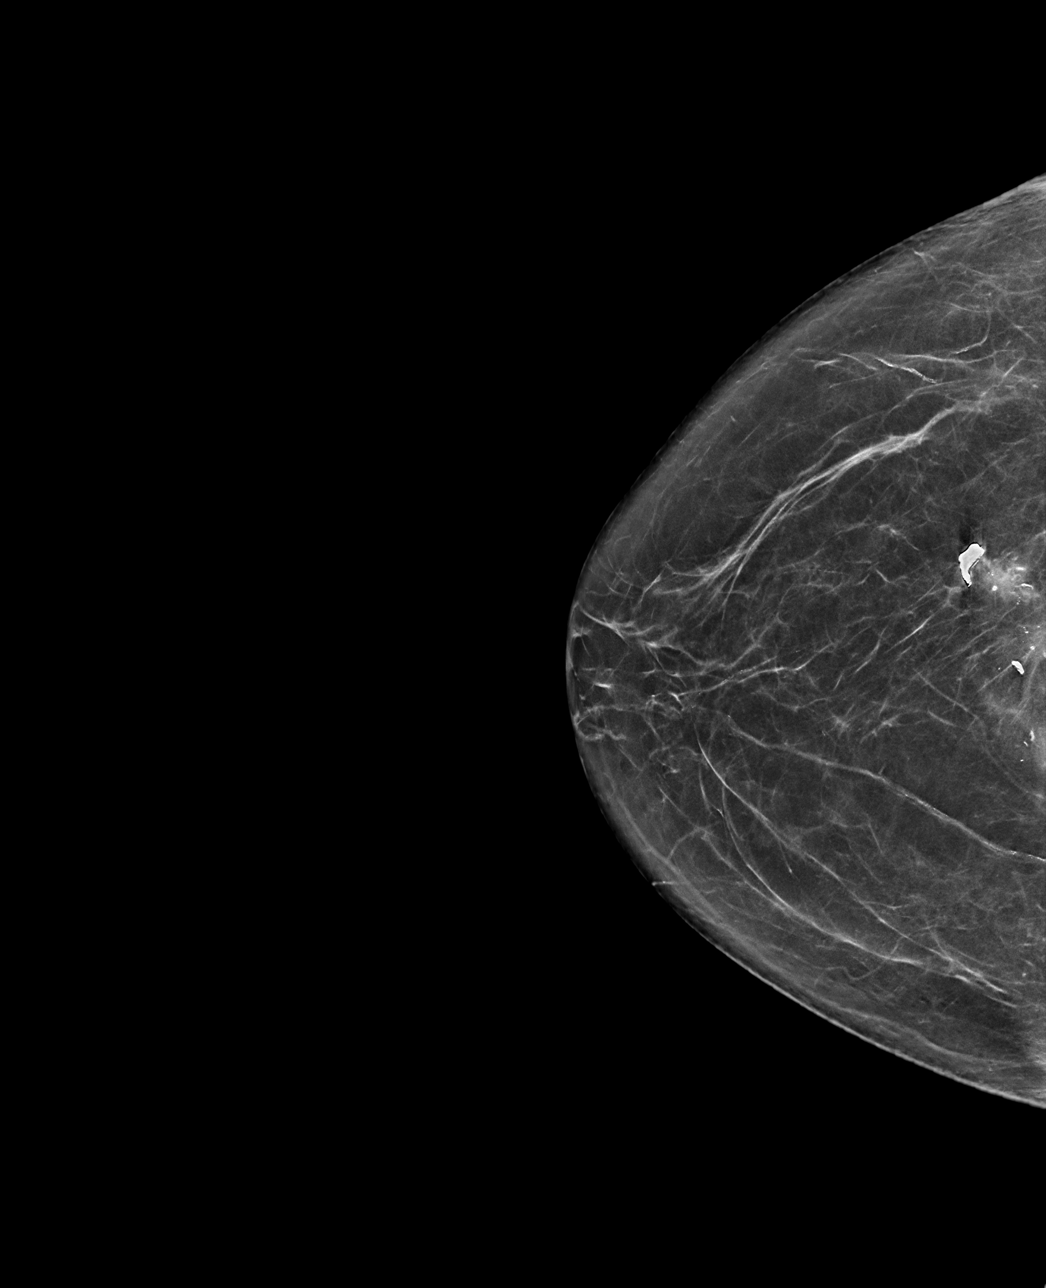

[R CC tomo · tomo slice 34/67.0]
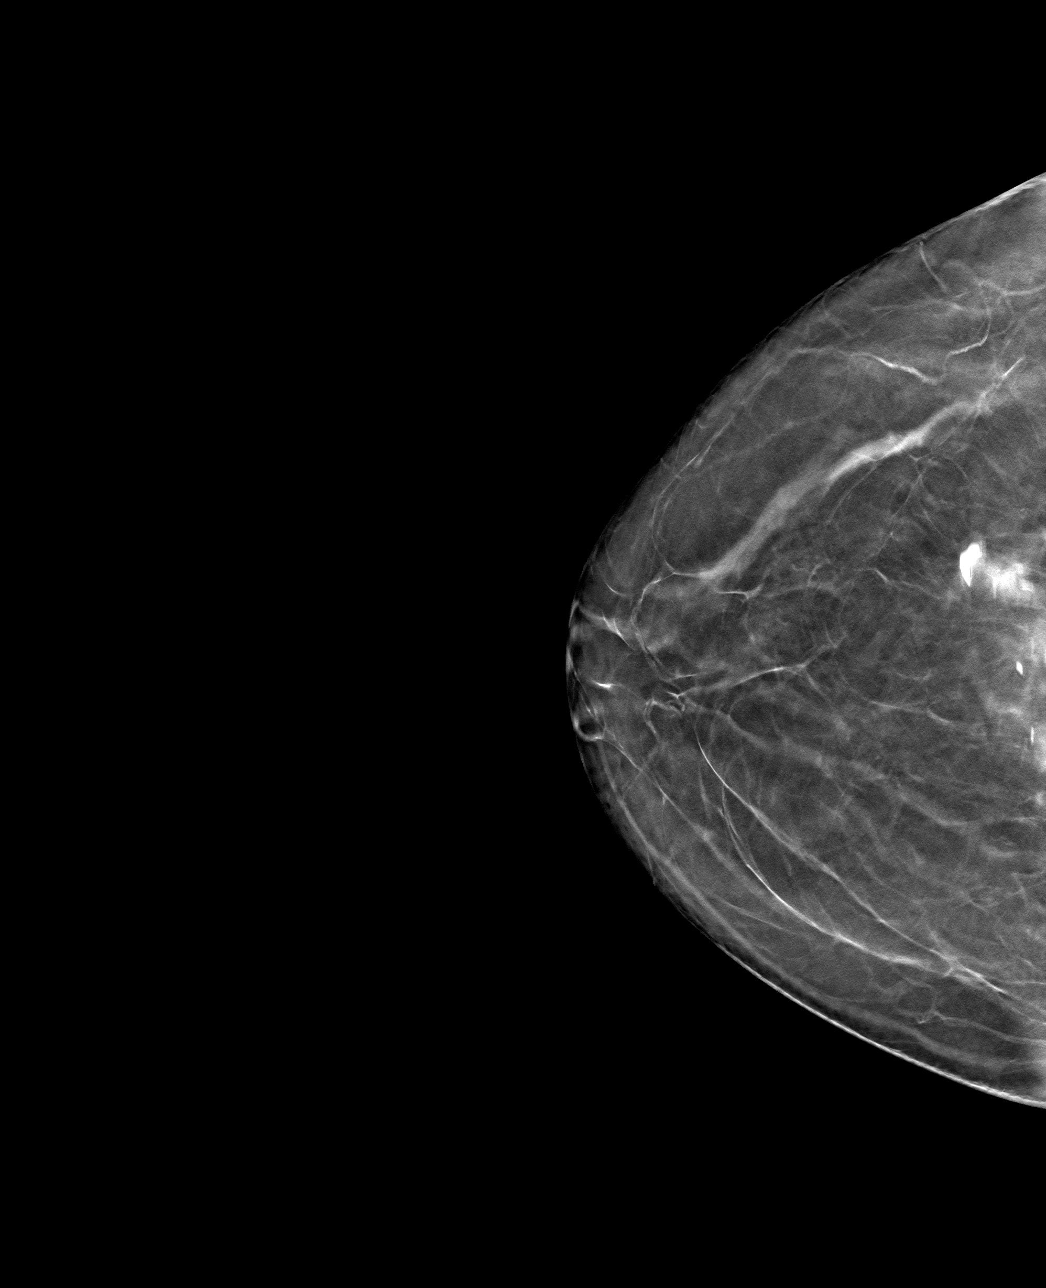

[R MLO tomo · tomo slice 35/68.0]
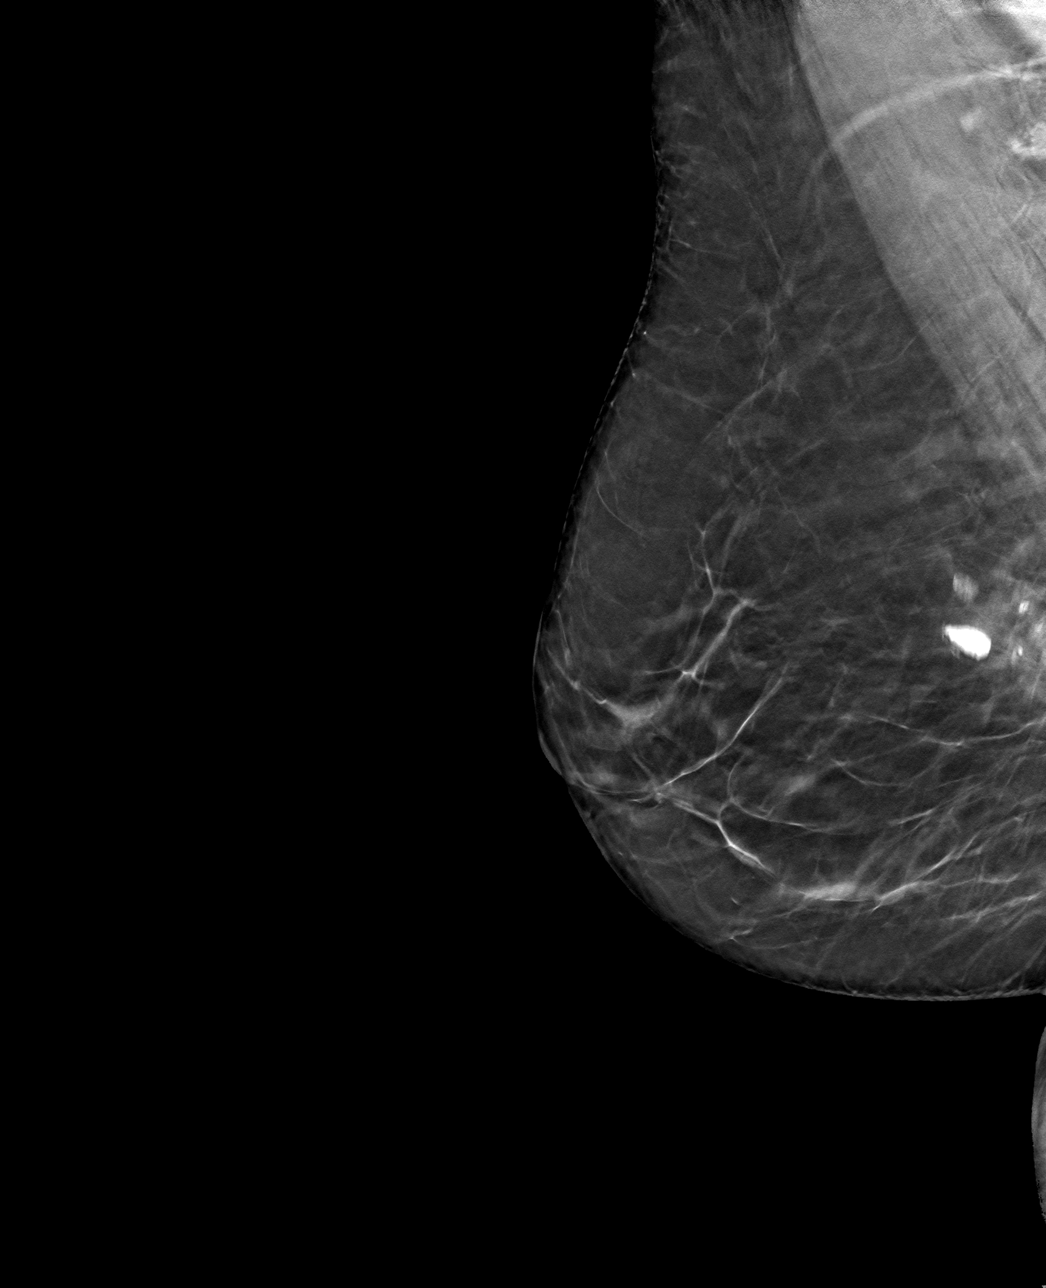

[4 of 12 positions shown; findings below may reference images not displayed]

FINDINGS: Postoperative changes in the posterior central portion of the RIGHT
breast consistent with previous implant removal. No suspicious mass,
distortion, or microcalcifications are identified to suggest
presence of malignancy.

On physical exam, the appearance of the RIGHT nipple is symmetric
compared to the LEFT and completely normal. No skin thickening
erythema, skin flaking.
IMPRESSION: No mammographic evidence for malignancy.

RECOMMENDATION:
Bilateral screening mammogram is recommended in November 2021.

I have discussed the findings and recommendations with the patient.
If applicable, a reminder letter will be sent to the patient
regarding the next appointment.

BI-RADS CATEGORY  1: Negative.

## 2023-08-16 ENCOUNTER — Ambulatory Visit: Payer: Medicare Other | Attending: Obstetrics and Gynecology

## 2023-08-16 ENCOUNTER — Other Ambulatory Visit: Payer: Self-pay

## 2023-08-16 DIAGNOSIS — M6281 Muscle weakness (generalized): Secondary | ICD-10-CM | POA: Insufficient documentation

## 2023-08-16 DIAGNOSIS — R279 Unspecified lack of coordination: Secondary | ICD-10-CM | POA: Insufficient documentation

## 2023-08-16 DIAGNOSIS — R293 Abnormal posture: Secondary | ICD-10-CM | POA: Insufficient documentation

## 2023-08-16 NOTE — Therapy (Signed)
OUTPATIENT PHYSICAL THERAPY FEMALE PELVIC EVALUATION   Patient Name: NGUYEN BUTLER MRN: [id number] DOB:03/19/1941, 83 y.o., female Today's Date: 08/16/2023  END OF SESSION:  PT End of Session - 08/16/23 1143     Visit Number 1    Date for PT Re-Evaluation 10/11/23    Authorization Type [employer]    Progress Note Due on Visit 10    PT Start Time 1145    PT Stop Time 1225    PT Time Calculation (min) 40 min    Activity Tolerance Patient tolerated treatment well    Behavior During Therapy WFL for tasks assessed/performed             Past Medical History:  Diagnosis Date   Anxiety    Arthritis    Cholecystitis    Chronic constipation    takes magnesium   Depression    History of 2019 novel coronavirus disease (COVID-19)    last positive coivd test 07-24-2019 @[facility]  w/ fever, RUQ pain, N/V and dx cholecystitis  (08-17-2019 pt stated symptoms resolved when a week) /   pt stated she was dx covid 03-2019 symptoms fever and fatigue and resolved in 2 wks   History of echocardiogram    07-28-2010 in epic,  showed ef 55-60%, G1DD, mild LAE, and mild AR, TR, MR (normal valves with no stenosis), PASP 24 mmHg   History of TIA (transient ischemic attack)    01/ 2012  --- per pt no residual   History of traumatic head injury    08-17-2019  per pt as child had serious head injury, hit in back of head, see knows she did not stay in the hospital but was seen @ED    Hyperlipemia    Hypertension    followed by pcp  (08-17-2019  per pt had a stress test several years ago done by Dr Donnie Aho, told normal, results not available in epic)   Leg cramps    takes magnesium   Stroke (HCC) 1/12   rt sides weakness-lasted 36hr   Past Surgical History:  Procedure Laterality Date   APPENDECTOMY  age 31   CHOLECYSTECTOMY N/A 08/21/2019   Procedure: LAPAROSCOPIC CHOLECYSTECTOMY;  Surgeon: Kinsinger, De Blanch, MD;  Location: [facility];  Service: General;  Laterality: N/A;    COLONOSCOPY     EXPLORATORY LAPAROTOMY  age 62   removal ovarian cysts   ORIF WRIST FRACTURE Right 03/02/2015   Procedure: OPEN REDUCTION INTERNAL FIXATION (ORIF) WRIST FRACTURE;  Surgeon: Bradly Bienenstock, MD;  Location: [facility];  Service: Orthopedics;  Laterality: Right;   TONSILLECTOMY  child   TOTAL ABDOMINAL HYSTERECTOMY  1980   ovaries remain   TRIGGER FINGER RELEASE  05/30/2012   Procedure: RELEASE TRIGGER FINGER/A-1 PULLEY;  Surgeon: Wyn Forster., MD;  Location: [facility];  Service: Orthopedics;  Laterality: Left;  Left Long, Ring, and Small finger A-1 Pulley Release   Patient Active Problem List   Diagnosis Date Noted   Transient cerebral ischemia 07/28/2010    PCP: Merri Brunette, MD  REFERRING PROVIDER: Marcelle Overlie, MD  REFERRING DIAG: N81.10 (ICD-10-CM) - Cystocele, unspecified  THERAPY DIAG:  Unspecified lack of coordination  Abnormal posture  Muscle weakness (generalized)  Rationale for Evaluation and Treatment: Rehabilitation  ONSET DATE: 4 months  SUBJECTIVE:  SUBJECTIVE STATEMENT: Pt states that she was bathing one day and felt bladder. In the last week she started having some Rt sided vaginal pain. She states that she has been sedentary for a long time and notices more improvement when she isn't active. She is scared to do anything because of her symptoms.   Fluid intake: Yes: 60oz of water a day, 1 cup of coffee, several cups of tea, sometimes juice    PAIN:  Are you having pain? Yes NPRS scale: 0/10 - feels like she can't even describe it as pain Pain location:  Rt vaginal wall/pelvis - for 1 week; low back pain  Pain type: twinge/tug; aching Pain description: intermittent   Aggravating factors: random Relieving factors: goes away on it's  own  PRECAUTIONS: None  RED FLAGS: None   WEIGHT BEARING RESTRICTIONS: No  FALLS:  Has patient fallen in last 6 months? Yes. Number of falls hiking, just once;   LIVING ENVIRONMENT: Lives with: lives alone Lives in: House/apartment  OCCUPATION: homemaker  PLOF: Independent  PATIENT GOALS: go back to household chores without fear of hurting anything; strengthening exercises  PERTINENT HISTORY:  Appendectomy, Cholecystectomy, laparotomy, total hysterectomy (no oophorectomy), chronic constipation, anxiety/depression, history of transient ischemic attack    BOWEL MOVEMENT: Pain with bowel movement: Yes - severe constipation her entire life - magnesium has been very helpful  Type of bowel movement:Frequency 1x/day and Strain No Fully empty rectum: No, sometimes will do miralax if she feels like she is not empty (probably 2x/week) Leakage: No Pads: No Fiber supplement: No  URINATION: Pain with urination: No Fully empty bladder: No - has noticed more difficulty, but feels like she can empty Stream: Strong Urgency: Yes: sometimes after she's been to the bathroom she has to go back after a couple minutes Frequency: feels like it is becoming more frequent, not sure how often she is going, but can go for hours; at least 1-2x/night Leakage:  occasional leaking with sneezing in the mornings Pads: No  INTERCOURSE: Not sexually active right now  PREGNANCY: Vaginal deliveries 3 Tearing No C-section deliveries 0 Currently pregnant No  PROLAPSE: Does not have sensation of something sitting in vagina, but she can see it and feel it with her hand   OBJECTIVE:  Note: Objective measures were completed at Evaluation unless otherwise noted.  08/16/2023 PATIENT SURVEYS:  PFIQ-7 52  COGNITION: Overall cognitive status: Within functional limits for tasks assessed     SENSATION: Light touch: Appears intact Proprioception: Appears intact  FUNCTIONAL TESTS:   Curl up test:  abdominal distortion   GAIT: Comments: WNL  POSTURE: rounded shoulders, forward head, decreased lumbar lordosis, increased thoracic kyphosis, and posterior pelvic tilt   PALPATION:   General  abdominal tenderness and tightness throughout; notable scar tissue in lower abdomen; rib cage restriction with breathing                External Perineal Exam pale, labial fusion                             Internal Pelvic Floor no tenderness  Patient confirms identification and approves PT to assess internal pelvic floor and treatment Yes  PELVIC MMT:   MMT eval  Vaginal 1/5, no endurance, no repeats, took effort and concentration to get single active contraction and initially bearing down; able to correct with multimodal cues and breath coordination   Diastasis Recti 2 finger width separation throughout midline with  distortion in upper abdominals  (Blank rows = not tested)        TONE: low  PROLAPSE: Grade 2 anterior vaginal wall laxity  TODAY'S TREATMENT:                                                                                                                              DATE:  08/16/2023  EVAL  Neuromuscular re-education: Pt provides verbal consent for internal vaginal/rectal pelvic floor exam. Internal vaginal pelvic floor muscle contraction training Quick flicks Long holds Therapeutic activities: Double voiding Pressure management education    PATIENT EDUCATION:  Education details: see above  Person educated: Patient Education method: Explanation, Demonstration, Tactile cues, Verbal cues, and Handouts Education comprehension: verbalized understanding  HOME EXERCISE PROGRAM: [id number]  ASSESSMENT:  CLINICAL IMPRESSION: Patient is a 83 y.o. Female who was seen today for physical therapy evaluation and treatment for prolapse. Exam findings notable for abdominal tenderness and scar tissue restriction, core weakness with diastasis recti and distortion, vulvar  paleness and labial fusion, low tone in pelvic floor muscles, significant difficulty with pelvic floor muscle contraction coordination, pelvic floor muscle weakness and decreased endurance, and grade 2 anterior vaginal wall laxity. Signs and symptoms are most consistent with history of poor pressure management and pelvic floor muscle weakness/poor coordination. Initial treatment consisted of pelvic floor muscle contraction training. She will continue to benefit from skilled PT intervention in order to improve pelvic floor muscle strength and coordination, address abdominal pressure management, and begin/progress functional strengthening program to tolerance.   OBJECTIVE IMPAIRMENTS: decreased activity tolerance, decreased coordination, decreased endurance, decreased strength, increased fascial restrictions, increased muscle spasms, impaired tone, postural dysfunction, and pain.   ACTIVITY LIMITATIONS: continence  PARTICIPATION LIMITATIONS:  house chores, exercise   PERSONAL FACTORS: 1 comorbidity: medical history  are also affecting patient's functional outcome.   REHAB POTENTIAL: Good  CLINICAL DECISION MAKING: Stable/uncomplicated  EVALUATION COMPLEXITY: Low   GOALS: Goals reviewed with patient? Yes  SHORT TERM GOALS: Target date: 09/13/2023   Pt will be independent with HEP.  Baseline: Goal status: INITIAL  2.  Pt will be able to correctly perform diaphragmatic breathing and appropriate pressure management in order to prevent worsening vaginal wall laxity and improve pelvic floor A/ROM.   Baseline:  Goal status: INITIAL  3.  Pt will increase pelvic floor muscle contraction strength and coordination to 2/5.  Baseline:  Goal status: INITIAL  4.  Pt will be independent with the knack, urge suppression technique, and double voiding in order to improve bladder habits and decrease urinary incontinence.   Baseline:  Goal status: INITIAL   LONG TERM GOALS: Target date:  10/11/2023  Pt will be independent with advanced HEP.  Baseline:  Goal status: INITIAL  2.  Pt will improve PFIQ-7 score by 50% in order to demonstrate improvement in functional ability.  Baseline:  Goal status: INITIAL  3.  Pt will improve pelvic floor muscle strength and coordination to 3/5.  Baseline:  Goal status: INITIAL  4.  Pt will improve pelvic floor muscle endurance to greater than 10 seconds. Baseline:  Goal status: INITIAL  5.  Pt will be able to perform supine curl-up test without any abdominal distortion or breath holding.  Baseline:  Goal status: INITIAL    PLAN:  PT FREQUENCY: 1-2x/week  PT DURATION: 6 months  PLANNED INTERVENTIONS: 97110-Therapeutic exercises, 97530- Therapeutic activity, [id number]- Neuromuscular re-education, 97535- Self Care, [id number]- Manual therapy, Dry Needling, and Biofeedback  PLAN FOR NEXT SESSION: Begin core training.    Julio Alm, PT, DPT01/28/251:42 PM

## 2023-08-16 NOTE — Patient Instructions (Signed)
Double-voiding:  This technique is to help with post-void dribbling, or leaking a little bit when you stand up right after urinating.  Use relaxed toileting mechanics to urinate as much as you feel like you have to without straining.  Sit back upright from leaning forward and relax this way for 10-20 seconds.  Lean forward again to finish voiding any amount more.

## 2023-08-23 ENCOUNTER — Ambulatory Visit: Payer: Medicare Other | Attending: Obstetrics and Gynecology | Admitting: Physical Therapy

## 2023-08-23 ENCOUNTER — Encounter: Payer: Self-pay | Admitting: Physical Therapy

## 2023-08-23 DIAGNOSIS — R279 Unspecified lack of coordination: Secondary | ICD-10-CM | POA: Diagnosis not present

## 2023-08-23 DIAGNOSIS — M6281 Muscle weakness (generalized): Secondary | ICD-10-CM | POA: Insufficient documentation

## 2023-08-23 DIAGNOSIS — R293 Abnormal posture: Secondary | ICD-10-CM | POA: Diagnosis not present

## 2023-08-23 NOTE — Therapy (Signed)
 OUTPATIENT PHYSICAL THERAPY FEMALE PELVIC EVALUATION   Patient Name: [name] [name] MRN: [id number] DOB:07/19/1941, 83 y.o., female Today's Date: 08/23/2023  END OF SESSION:  PT End of Session - 08/23/23 1243     Visit Number 2    Date for PT Re-Evaluation 10/11/23    Authorization Type [employer] Medicare    Progress Note Due on Visit 10    PT Start Time 1145    PT Stop Time 1240    PT Time Calculation (min) 55 min    Activity Tolerance Patient tolerated treatment well    Behavior During Therapy WFL for tasks assessed/performed              Past Medical History:  Diagnosis Date   Anxiety    Arthritis    Cholecystitis    Chronic constipation    takes magnesium   Depression    History of 2019 novel coronavirus disease (COVID-19)    last positive coivd test 07-24-2019 @[facility]  w/ fever, RUQ pain, N/V and dx cholecystitis  (08-17-2019 pt stated symptoms resolved when a week) /   pt stated she was dx covid 03-2019 symptoms fever and fatigue and resolved in 2 wks   History of echocardiogram    07-28-2010 in epic,  showed ef 55-60%, G1DD, mild LAE, and mild AR, TR, MR (normal valves with no stenosis), PASP 24 mmHg   History of TIA (transient ischemic attack)    01/ 2012  --- per pt no residual   History of traumatic head injury    08-17-2019  per pt as child had serious head injury, hit in back of head, see knows she did not stay in the hospital but was seen @ED    Hyperlipemia    Hypertension    followed by pcp  (08-17-2019  per pt had a stress test several years ago done by Dr Blanca, told normal, results not available in epic)   Leg cramps    takes magnesium   Stroke (HCC) 1/12   rt sides weakness-lasted 36hr   Past Surgical History:  Procedure Laterality Date   APPENDECTOMY  age 65   CHOLECYSTECTOMY N/A 08/21/2019   Procedure: LAPAROSCOPIC CHOLECYSTECTOMY;  Surgeon: Kinsinger, Herlene Righter, MD;  Location: [facility];  Service: General;  Laterality: N/A;    COLONOSCOPY     EXPLORATORY LAPAROTOMY  age 65   removal ovarian cysts   ORIF WRIST FRACTURE Right 03/02/2015   Procedure: OPEN REDUCTION INTERNAL FIXATION (ORIF) WRIST FRACTURE;  Surgeon: Prentice Pagan, MD;  Location: [facility];  Service: Orthopedics;  Laterality: Right;   TONSILLECTOMY  child   TOTAL ABDOMINAL HYSTERECTOMY  1980   ovaries remain   TRIGGER FINGER RELEASE  05/30/2012   Procedure: RELEASE TRIGGER FINGER/A-1 PULLEY;  Surgeon: Lamar LULLA Leonor Mickey., MD;  Location: [facility];  Service: Orthopedics;  Laterality: Left;  Left Long, Ring, and Small finger A-1 Pulley Release   Patient Active Problem List   Diagnosis Date Noted   Transient cerebral ischemia 07/28/2010    PCP: Claudene Pellet, MD  REFERRING PROVIDER: Mat Browning, MD  REFERRING DIAG: N81.10 (ICD-10-CM) - Cystocele, unspecified  THERAPY DIAG:  Unspecified lack of coordination  Abnormal posture  Muscle weakness (generalized)  Rationale for Evaluation and Treatment: Rehabilitation  ONSET DATE: 4 months  SUBJECTIVE:  SUBJECTIVE STATEMENT: Thinks she has been pushing down with kegels and thinks she isn't doing them right.   Fluid intake: Yes: 60oz of water a day, 1 cup of coffee, several cups of tea, sometimes juice    PAIN:  Are you having pain? Yes NPRS scale: 0/10 - feels like she can't even describe it as pain Pain location:  Rt vaginal wall/pelvis - for 1 week; low back pain  Pain type: twinge/tug; aching Pain description: intermittent   Aggravating factors: random Relieving factors: goes away on it's own  PRECAUTIONS: None  RED FLAGS: None   WEIGHT BEARING RESTRICTIONS: No  FALLS:  Has patient fallen in last 6 months? Yes. Number of falls hiking, just once;   LIVING ENVIRONMENT: Lives  with: lives alone Lives in: House/apartment  OCCUPATION: homemaker  PLOF: Independent  PATIENT GOALS: go back to household chores without fear of hurting anything; strengthening exercises  PERTINENT HISTORY:  Appendectomy, Cholecystectomy, laparotomy, total hysterectomy (no oophorectomy), chronic constipation, anxiety/depression, history of transient ischemic attack    BOWEL MOVEMENT: Pain with bowel movement: Yes - severe constipation her entire life - magnesium has been very helpful  Type of bowel movement:Frequency 1x/day and Strain No Fully empty rectum: No, sometimes will do miralax if she feels like she is not empty (probably 2x/week) Leakage: No Pads: No Fiber supplement: No  URINATION: Pain with urination: No Fully empty bladder: No - has noticed more difficulty, but feels like she can empty Stream: Strong Urgency: Yes: sometimes after she's been to the bathroom she has to go back after a couple minutes Frequency: feels like it is becoming more frequent, not sure how often she is going, but can go for hours; at least 1-2x/night Leakage:  occasional leaking with sneezing in the mornings Pads: No  INTERCOURSE: Not sexually active right now  PREGNANCY: Vaginal deliveries 3 Tearing No C-section deliveries 0 Currently pregnant No  PROLAPSE: Does not have sensation of something sitting in vagina, but she can see it and feel it with her hand   OBJECTIVE:  Note: Objective measures were completed at Evaluation unless otherwise noted.  08/16/2023 PATIENT SURVEYS:  PFIQ-7 52  COGNITION: Overall cognitive status: Within functional limits for tasks assessed     SENSATION: Light touch: Appears intact Proprioception: Appears intact  FUNCTIONAL TESTS:   Curl up test: abdominal distortion   GAIT: Comments: WNL  POSTURE: rounded shoulders, forward head, decreased lumbar lordosis, increased thoracic kyphosis, and posterior pelvic tilt   PALPATION:   General   abdominal tenderness and tightness throughout; notable scar tissue in lower abdomen; rib cage restriction with breathing                External Perineal Exam pale, labial fusion                             Internal Pelvic Floor no tenderness  Patient confirms identification and approves PT to assess internal pelvic floor and treatment Yes  PELVIC MMT:   MMT eval  Vaginal 1/5, no endurance, no repeats, took effort and concentration to get single active contraction and initially bearing down; able to correct with multimodal cues and breath coordination   Diastasis Recti 2 finger width separation throughout midline with distortion in upper abdominals  (Blank rows = not tested)        TONE: low  PROLAPSE: Grade 2 anterior vaginal wall laxity  TODAY'S TREATMENT:  DATE:  08/16/2023  EVAL  Neuromuscular re-education: Pt provides verbal consent for internal vaginal/rectal pelvic floor exam. Internal vaginal pelvic floor muscle contraction training Quick flicks Long holds Therapeutic activities: Double voiding Pressure management education  08/23/23:  Therapeutic activities: Pt had several questions about prolapse, estrogen cream use (educated to ask MD about this as she would need a prescription and need to talk to doctor about this),  female pelvic floor anatomy, pressure management, and pelvic floor contraction mechanics, prolapse relief positions and handout given  NMRE: all exercises for exhale and pelvic floor contraction with activity for improved coordination of muscle activation and decreased stress at pelvic floor for decreased leakage.   2x10 opp hands/knee ball press Green band 2x10 hip abduction in hooklying  X10 bridges Seated pelvic floor contractions - initially reported slight movement felt between legs, given towel roll to attempt with this and  improved to feeling contractions consistently per pt.  Pt did decline internal to assess mechanics of pelvic floor but agreeable to attempt this first.   PATIENT EDUCATION:  Education details: see above  Person educated: Patient Education method: Explanation, Demonstration, Tactile cues, Verbal cues, and Handouts Education comprehension: verbalized understanding  HOME EXERCISE PROGRAM: [id number]  ASSESSMENT:  CLINICAL IMPRESSION: Patient presents for treatment, session focused on answering all of pt's questions with prolapse, anatomy, mechanics of pelvic floor and contractions, etc. Pt denied additional questions after this. Pt session then progressed to NMRE with coordination of pelvic floor and activity to promote improved pelvic floor strength and pressure management to decreased worsening of prolapse. She will continue to benefit from skilled PT intervention in order to improve pelvic floor muscle strength and coordination, address abdominal pressure management, and begin/progress functional strengthening program to tolerance.   OBJECTIVE IMPAIRMENTS: decreased activity tolerance, decreased coordination, decreased endurance, decreased strength, increased fascial restrictions, increased muscle spasms, impaired tone, postural dysfunction, and pain.   ACTIVITY LIMITATIONS: continence  PARTICIPATION LIMITATIONS:  house chores, exercise   PERSONAL FACTORS: 1 comorbidity: medical history  are also affecting patient's functional outcome.   REHAB POTENTIAL: Good  CLINICAL DECISION MAKING: Stable/uncomplicated  EVALUATION COMPLEXITY: Low   GOALS: Goals reviewed with patient? Yes  SHORT TERM GOALS: Target date: 09/13/2023   Pt will be independent with HEP.  Baseline: Goal status: INITIAL  2.  Pt will be able to correctly perform diaphragmatic breathing and appropriate pressure management in order to prevent worsening vaginal wall laxity and improve pelvic floor A/ROM.   Baseline:   Goal status: INITIAL  3.  Pt will increase pelvic floor muscle contraction strength and coordination to 2/5.  Baseline:  Goal status: INITIAL  4.  Pt will be independent with the knack, urge suppression technique, and double voiding in order to improve bladder habits and decrease urinary incontinence.   Baseline:  Goal status: INITIAL   LONG TERM GOALS: Target date: 10/11/2023  Pt will be independent with advanced HEP.  Baseline:  Goal status: INITIAL  2.  Pt will improve PFIQ-7 score by 50% in order to demonstrate improvement in functional ability.  Baseline:  Goal status: INITIAL  3.  Pt will improve pelvic floor muscle strength and coordination to 3/5. Baseline:  Goal status: INITIAL  4.  Pt will improve pelvic floor muscle endurance to greater than 10 seconds. Baseline:  Goal status: INITIAL  5.  Pt will be able to perform supine curl-up test without any abdominal distortion or breath holding.  Baseline:  Goal status: INITIAL    PLAN:  PT  FREQUENCY: 1-2x/week  PT DURATION: 6 months  PLANNED INTERVENTIONS: 97110-Therapeutic exercises, 97530- Therapeutic activity, [id number]- Neuromuscular re-education, 97535- Self Care, [id number]- Manual therapy, Dry Needling, and Biofeedback  PLAN FOR NEXT SESSION: Begin core training.    Darryle Navy, PT, DPT 08/22/2510:47 PM

## 2023-08-23 NOTE — Patient Instructions (Signed)
  15-20 mins in the evenings based on tolerance.  Please clear any positions with doctor as needed.  Please stop any position if negative symptoms occur (dizziness/lightheadedness/fatigue/increased pain, etc.)  

## 2023-08-30 ENCOUNTER — Encounter: Payer: Self-pay | Admitting: Physical Therapy

## 2023-08-30 ENCOUNTER — Ambulatory Visit: Payer: Medicare Other | Admitting: Physical Therapy

## 2023-08-30 DIAGNOSIS — R293 Abnormal posture: Secondary | ICD-10-CM

## 2023-08-30 DIAGNOSIS — M6281 Muscle weakness (generalized): Secondary | ICD-10-CM | POA: Diagnosis not present

## 2023-08-30 DIAGNOSIS — R279 Unspecified lack of coordination: Secondary | ICD-10-CM
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
## 2023-09-06 ENCOUNTER — Ambulatory Visit: Payer: Medicare Other | Admitting: Physical Therapy

## 2023-09-06 ENCOUNTER — Encounter: Payer: Self-pay | Admitting: Physical Therapy

## 2023-09-06 DIAGNOSIS — M6281 Muscle weakness (generalized): Secondary | ICD-10-CM | POA: Diagnosis not present

## 2023-09-06 DIAGNOSIS — R279 Unspecified lack of coordination: Secondary | ICD-10-CM

## 2023-09-06 DIAGNOSIS — R293 Abnormal posture: Secondary | ICD-10-CM

## 2023-09-06 NOTE — Therapy (Signed)
 OUTPATIENT PHYSICAL THERAPY FEMALE PELVIC TREATMENT   Patient Name: [name] [name] MRN: [id number] DOB:08-22-1940, 83 y.o., female Today's Date: 09/06/2023  END OF SESSION:  PT End of Session - 09/06/23 1154     Visit Number 4    Date for PT Re-Evaluation 10/11/23    Authorization Type [employer]    Progress Note Due on Visit 10    PT Start Time 1150   arrival   PT Stop Time 1230    PT Time Calculation (min) 40 min    Activity Tolerance Other (comment);Patient tolerated treatment well    Behavior During Therapy [facility] for tasks assessed/performed              Past Medical History:  Diagnosis Date   Anxiety    Arthritis    Cholecystitis    Chronic constipation    takes magnesium   Depression    History of 2019 novel coronavirus disease (COVID-19)    last positive coivd test 07-24-2019 @[facility]  w/ fever, RUQ pain, N/V and dx cholecystitis  (08-17-2019 pt stated symptoms resolved when a week) /   pt stated she was dx covid 03-2019 symptoms fever and fatigue and resolved in 2 wks   History of echocardiogram    07-28-2010 in epic,  showed ef 55-60%, G1DD, mild LAE, and mild AR, TR, MR (normal valves with no stenosis), PASP 24 mmHg   History of TIA (transient ischemic attack)    01/ 2012  --- per pt no residual   History of traumatic head injury    08-17-2019  per pt as child had serious head injury, hit in back of head, see knows she did not stay in the hospital but was seen @ED    Hyperlipemia    Hypertension    followed by pcp  (08-17-2019  per pt had a stress test several years ago done by Dr Donnie Aho, told normal, results not available in epic)   Leg cramps    takes magnesium   Stroke (HCC) 1/12   rt sides weakness-lasted 36hr   Past Surgical History:  Procedure Laterality Date   APPENDECTOMY  age 58   CHOLECYSTECTOMY N/A 08/21/2019   Procedure: LAPAROSCOPIC CHOLECYSTECTOMY;  Surgeon: Kinsinger, De Blanch, MD;  Location: [facility];  Service: General;   Laterality: N/A;   COLONOSCOPY     EXPLORATORY LAPAROTOMY  age 8   removal ovarian cysts   ORIF WRIST FRACTURE Right 03/02/2015   Procedure: OPEN REDUCTION INTERNAL FIXATION (ORIF) WRIST FRACTURE;  Surgeon: Bradly Bienenstock, MD;  Location: [facility];  Service: Orthopedics;  Laterality: Right;   TONSILLECTOMY  child   TOTAL ABDOMINAL HYSTERECTOMY  1980   ovaries remain   TRIGGER FINGER RELEASE  05/30/2012   Procedure: RELEASE TRIGGER FINGER/A-1 PULLEY;  Surgeon: Wyn Forster., MD;  Location: [facility];  Service: Orthopedics;  Laterality: Left;  Left Long, Ring, and Small finger A-1 Pulley Release   Patient Active Problem List   Diagnosis Date Noted   Transient cerebral ischemia 07/28/2010    PCP: Merri Brunette, MD  REFERRING PROVIDER: Marcelle Overlie, MD  REFERRING DIAG: N81.10 (ICD-10-CM) - Cystocele, unspecified  THERAPY DIAG:  Unspecified lack of coordination  Abnormal posture  Muscle weakness (generalized)  Rationale for Evaluation and Treatment: Rehabilitation  ONSET DATE: 4 months  SUBJECTIVE:  SUBJECTIVE STATEMENT: Did order vaginal weights and just got in this week and hasn't tried them yet wanted to go over these. Pt reports she has been helping daughters and grandchildren and lives alone so she has a lot to do with maintaining her home/yard work and with everything has felt overwhelmed. Pt report she plans to practice HEP and use kegel weights at home but may need a few weeks to get her schedule more free at home and then can do more PT.   Fluid intake: Yes: 60oz of water a day, 1 cup of coffee, several cups of tea, sometimes juice    PAIN:  no 09/06/23  Are you having pain? Yes NPRS scale: 0/10 - feels like she can't even describe it as pain Pain location:  Rt  vaginal wall/pelvis - for 1 week; low back pain  Pain type: twinge/tug; aching Pain description: intermittent   Aggravating factors: random Relieving factors: goes away on it's own  PRECAUTIONS: None  RED FLAGS: None   WEIGHT BEARING RESTRICTIONS: No  FALLS:  Has patient fallen in last 6 months? Yes. Number of falls hiking, just once;   LIVING ENVIRONMENT: Lives with: lives alone Lives in: House/apartment  OCCUPATION: homemaker  PLOF: Independent  PATIENT GOALS: go back to household chores without fear of hurting anything; strengthening exercises  PERTINENT HISTORY:  Appendectomy, Cholecystectomy, laparotomy, total hysterectomy (no oophorectomy), chronic constipation, anxiety/depression, history of transient ischemic attack    BOWEL MOVEMENT: Pain with bowel movement: Yes - severe constipation her entire life - magnesium has been very helpful  Type of bowel movement:Frequency 1x/day and Strain No Fully empty rectum: No, sometimes will do miralax if she feels like she is not empty (probably 2x/week) Leakage: No Pads: No Fiber supplement: No  URINATION: Pain with urination: No Fully empty bladder: No - has noticed more difficulty, but feels like she can empty Stream: Strong Urgency: Yes: sometimes after she's been to the bathroom she has to go back after a couple minutes Frequency: feels like it is becoming more frequent, not sure how often she is going, but can go for hours; at least 1-2x/night Leakage:  occasional leaking with sneezing in the mornings Pads: No  INTERCOURSE: Not sexually active right now  PREGNANCY: Vaginal deliveries 3 Tearing No C-section deliveries 0 Currently pregnant No  PROLAPSE: Does not have sensation of something sitting in vagina, but she can see it and feel it with her hand   OBJECTIVE:  Note: Objective measures were completed at Evaluation unless otherwise noted.  08/16/2023 PATIENT SURVEYS:  PFIQ-7 52  COGNITION: Overall  cognitive status: Within functional limits for tasks assessed     SENSATION: Light touch: Appears intact Proprioception: Appears intact  FUNCTIONAL TESTS:   Curl up test: abdominal distortion   GAIT: Comments: WNL  POSTURE: rounded shoulders, forward head, decreased lumbar lordosis, increased thoracic kyphosis, and posterior pelvic tilt   PALPATION:   General  abdominal tenderness and tightness throughout; notable scar tissue in lower abdomen; rib cage restriction with breathing                External Perineal Exam pale, labial fusion                             Internal Pelvic Floor no tenderness  Patient confirms identification and approves PT to assess internal pelvic floor and treatment Yes  PELVIC MMT:   MMT eval 09/06/23   Vaginal 1/5, no  endurance, no repeats, took effort and concentration to get single active contraction and initially bearing down; able to correct with multimodal cues and breath coordination  2/5 improving to 3/5 for one rep with max cues, with 2/5 strength able to hold for 4s, 5 reps  Diastasis Recti 2 finger width separation throughout midline with distortion in upper abdominals   (Blank rows = not tested)        TONE: low  PROLAPSE: Grade 2 anterior vaginal wall laxity  TODAY'S TREATMENT:                                                                                                                              DATE:   08/23/23:  Therapeutic activities: Pt had several questions about prolapse, estrogen cream use (educated to ask MD about this as she would need a prescription and need to talk to doctor about this),  female pelvic floor anatomy, pressure management, and pelvic floor contraction mechanics, prolapse relief positions and handout given  NMRE: all exercises for exhale and pelvic floor contraction with activity for improved coordination of muscle activation and decreased stress at pelvic floor for decreased leakage.   2x10 opp  hands/knee ball press Green band 2x10 hip abduction in hooklying  X10 bridges Seated pelvic floor contractions - initially reported slight movement felt between legs, given towel roll to attempt with this and improved to feeling contractions consistently per pt.  Pt did decline internal to assess mechanics of pelvic floor but agreeable to attempt this first.   08/30/23:  Pt had questions about options for continued strengthening at home as she is having a hard time feeling kegels. But has been continuing exercises of bridges/hip abduction with band/ and hand/knee press at home without issue. Pt educated that internal treatment vaginally could help with awareness and strengthening of pelvic floor however pt declined today and requests to attempt next visit if able. Did educate her on vaginal weights as she could do these at home and how to use/frequency. Pt reports she would like to attempt these and denied additional questions. PT educated pt that these are not a must have but may be beneficial for her symptoms.  Attempted to transition appointment to strengthening however pt reports she is feeling sick and does not want to do exercises today. Attempted to encourage pt in stretching or light activity based exercises however pt requested to return home as she was feeling sick. Pt denied additional needs and PT educated to go to doctor for care if needed. Pt agreed and declined any care needs.   09/06/23: Pt educated on ways to implement HEP into daily routine, importance of HEP for improvement with strengthening. Pt verbalized understanding and states she may like to take a few weeks to improve this before returning to next appointment to better work on this. PT educated pt she can have regular PT sessions to work on this as well but pt declined and stated  she has a lot going on personally and feels overwhelmed with things she needs to get done and would like to work on this herself and return in a few  weeks to reassess progress and then with her schedule better and can do regular appointments.     Patient consented to internal pelvic floor assessment vaginally this date and found to have continued weakness however is improving. Findings above. 2x10 quick flicks completed, 2x10 pelvic floor contractions, x5 4-5s holds and x5 kegel +Cough.  Pt educated to continue HEP and this was updated to include strengthening and coordination of pelvic floor as well as prolapse relief positions. Pt agreed and reports she would like to return in a few weeks.   PATIENT EDUCATION:  Education details: see above  Person educated: Patient Education method: Programmer, multimedia, Demonstration, Tactile cues, Verbal cues, and Handouts Education comprehension: verbalized understanding  HOME EXERCISE PROGRAM: [id number]  ASSESSMENT:  CLINICAL IMPRESSION: Patient presents for treatment, session focused on review of HEP and updates, importance of participation in PT to progress toward goals, and internal feedback for pelvic floor to improve pt's awareness of pelvic floor mobility and techniques for decreased strain at pelvic floor with household tasks. Pt requests increased time between now and next appointment to improve her ability to work on HEP and pelvic floor coordination, pt was educated on her being able to do this with sessions. But states she is would be more available  once her schedule is more open. She will continue to benefit from skilled PT intervention in order to improve pelvic floor muscle strength and coordination, address abdominal pressure management, and begin/progress functional strengthening program to tolerance.   OBJECTIVE IMPAIRMENTS: decreased activity tolerance, decreased coordination, decreased endurance, decreased strength, increased fascial restrictions, increased muscle spasms, impaired tone, postural dysfunction, and pain.   ACTIVITY LIMITATIONS: continence  PARTICIPATION LIMITATIONS:  house  chores, exercise   PERSONAL FACTORS: 1 comorbidity: medical history  are also affecting patient's functional outcome.   REHAB POTENTIAL: Good  CLINICAL DECISION MAKING: Stable/uncomplicated  EVALUATION COMPLEXITY: Low   GOALS: Goals reviewed with patient? Yes  SHORT TERM GOALS: Target date: 09/13/2023   Pt will be independent with HEP.  Baseline: Goal status: on going  2.  Pt will be able to correctly perform diaphragmatic breathing and appropriate pressure management in order to prevent worsening vaginal wall laxity and improve pelvic floor A/ROM.   Baseline:  Goal status: Met  3.  Pt will increase pelvic floor muscle contraction strength and coordination to 2/5.  Baseline:  Goal status:Met  4.  Pt will be independent with the knack, urge suppression technique, and double voiding in order to improve bladder habits and decrease urinary incontinence.   Baseline:  Goal status: on going   LONG TERM GOALS: Target date: 10/11/2023  Pt will be independent with advanced HEP.  Baseline:  Goal status: on going  2.  Pt will improve PFIQ-7 score by 50% in order to demonstrate improvement in functional ability.  Baseline:  Goal status: on going  3.  Pt will improve pelvic floor muscle strength and coordination to 3/5. Baseline:  Goal status: on going  4.  Pt will improve pelvic floor muscle endurance to greater than 10 seconds. Baseline:  Goal status: on going  5.  Pt will be able to perform supine curl-up test without any abdominal distortion or breath holding.  Baseline:  Goal status: on going    PLAN:  PT FREQUENCY: 1-2x/week  PT DURATION: 6 months  PLANNED  INTERVENTIONS: 97110-Therapeutic exercises, 97530- Therapeutic activity, [id number]- Neuromuscular re-education, 97535- Self Care, [id number]- Manual therapy, Dry Needling, and Biofeedback  PLAN FOR NEXT SESSION: core and hip strengthening, coordination of pelvic floor with pressure management    Otelia Sergeant,  PT, DPT 02/18/257:08 PM

## 2023-09-28 DIAGNOSIS — I10 Essential (primary) hypertension: Secondary | ICD-10-CM | POA: Diagnosis not present

## 2023-09-28 DIAGNOSIS — E782 Mixed hyperlipidemia: Secondary | ICD-10-CM | POA: Diagnosis not present

## 2023-09-28 DIAGNOSIS — K219 Gastro-esophageal reflux disease without esophagitis: Secondary | ICD-10-CM | POA: Diagnosis not present

## 2023-10-11 ENCOUNTER — Ambulatory Visit
Admission: RE | Admit: 2023-10-11 | Discharge: 2023-10-11 | Disposition: A | Discharge status: Home / Self Care | Payer: Medicare Other | Source: Ambulatory Visit | Attending: Family Medicine | Admitting: Family Medicine

## 2023-10-11 DIAGNOSIS — E2839 Other primary ovarian failure: Secondary | ICD-10-CM

## 2023-10-11 DIAGNOSIS — M8588 Other specified disorders of bone density and structure, other site: Secondary | ICD-10-CM | POA: Diagnosis not present

## 2023-10-17 DIAGNOSIS — M810 Age-related osteoporosis without current pathological fracture: Secondary | ICD-10-CM | POA: Diagnosis not present

## 2023-10-17 DIAGNOSIS — M8588 Other specified disorders of bone density and structure, other site: Secondary | ICD-10-CM | POA: Diagnosis not present

## 2024-03-16 DIAGNOSIS — R413 Other amnesia: Secondary | ICD-10-CM | POA: Diagnosis not present

## 2024-03-16 DIAGNOSIS — I10 Essential (primary) hypertension: Secondary | ICD-10-CM | POA: Diagnosis not present

## 2024-04-12 DIAGNOSIS — Z1211 Encounter for screening for malignant neoplasm of colon: Secondary | ICD-10-CM | POA: Diagnosis not present

## 2024-04-12 DIAGNOSIS — E782 Mixed hyperlipidemia: Secondary | ICD-10-CM | POA: Diagnosis not present

## 2024-04-12 DIAGNOSIS — K219 Gastro-esophageal reflux disease without esophagitis: Secondary | ICD-10-CM | POA: Diagnosis not present

## 2024-04-12 DIAGNOSIS — Z23 Encounter for immunization: Secondary | ICD-10-CM | POA: Diagnosis not present

## 2024-04-12 DIAGNOSIS — G43009 Migraine without aura, not intractable, without status migrainosus: Secondary | ICD-10-CM | POA: Diagnosis not present

## 2024-04-12 DIAGNOSIS — Z0000 Encounter for general adult medical examination without abnormal findings: Secondary | ICD-10-CM | POA: Diagnosis not present

## 2024-04-12 DIAGNOSIS — I10 Essential (primary) hypertension: Secondary | ICD-10-CM | POA: Diagnosis not present

## 2024-04-12 DIAGNOSIS — M810 Age-related osteoporosis without current pathological fracture: Secondary | ICD-10-CM | POA: Diagnosis not present

## 2024-04-24 DIAGNOSIS — Z1211 Encounter for screening for malignant neoplasm of colon: Secondary | ICD-10-CM | POA: Diagnosis not present
